# Patient Record
Sex: Male | Born: 1995 | Hispanic: No | Marital: Single | Smoking: Never smoker
Health system: Southern US, Community
[De-identification: ages and names within clinical notes are randomized; demographics above are authoritative.]

## PROBLEM LIST (undated history)

## (undated) DIAGNOSIS — F840 Autistic disorder: Secondary | ICD-10-CM

## (undated) DIAGNOSIS — G4730 Sleep apnea, unspecified: Secondary | ICD-10-CM

## (undated) DIAGNOSIS — R569 Unspecified convulsions: Secondary | ICD-10-CM

## (undated) DIAGNOSIS — I10 Essential (primary) hypertension: Secondary | ICD-10-CM

---
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
## 2014-02-01 DIAGNOSIS — Z79899 Other long term (current) drug therapy: Secondary | ICD-10-CM | POA: Diagnosis not present

## 2014-02-07 DIAGNOSIS — Z79899 Other long term (current) drug therapy: Secondary | ICD-10-CM | POA: Diagnosis not present

## 2014-02-15 DIAGNOSIS — Z79899 Other long term (current) drug therapy: Secondary | ICD-10-CM | POA: Diagnosis not present

## 2014-02-21 DIAGNOSIS — Z79899 Other long term (current) drug therapy: Secondary | ICD-10-CM | POA: Diagnosis not present

## 2014-02-28 DIAGNOSIS — Z79899 Other long term (current) drug therapy: Secondary | ICD-10-CM | POA: Diagnosis not present

## 2014-03-08 DIAGNOSIS — Z79899 Other long term (current) drug therapy: Secondary | ICD-10-CM | POA: Diagnosis not present

## 2014-03-14 DIAGNOSIS — Z79899 Other long term (current) drug therapy: Secondary | ICD-10-CM | POA: Diagnosis not present

## 2014-03-21 DIAGNOSIS — Z79899 Other long term (current) drug therapy: Secondary | ICD-10-CM | POA: Diagnosis not present

## 2014-03-28 DIAGNOSIS — Z79899 Other long term (current) drug therapy: Secondary | ICD-10-CM | POA: Diagnosis not present

## 2014-04-04 DIAGNOSIS — Z79899 Other long term (current) drug therapy: Secondary | ICD-10-CM | POA: Diagnosis not present

## 2014-04-11 DIAGNOSIS — Z79899 Other long term (current) drug therapy: Secondary | ICD-10-CM | POA: Diagnosis not present

## 2014-04-18 DIAGNOSIS — Z79899 Other long term (current) drug therapy: Secondary | ICD-10-CM | POA: Diagnosis not present

## 2014-04-25 DIAGNOSIS — Z79899 Other long term (current) drug therapy: Secondary | ICD-10-CM | POA: Diagnosis not present

## 2014-05-02 DIAGNOSIS — Z79899 Other long term (current) drug therapy: Secondary | ICD-10-CM | POA: Diagnosis not present

## 2014-05-09 DIAGNOSIS — Z79899 Other long term (current) drug therapy: Secondary | ICD-10-CM | POA: Diagnosis not present

## 2014-05-14 NOTE — Consult Note (Signed)
PATIENT NAME:  Angel Rice, Angel Rice MR#:  [id number] DATE OF BIRTH:  05/11/1985  DATE OF CONSULTATION:  08/26/2013  REFERRING PHYSICIAN:   CONSULTING PHYSICIAN:  Audery Amel, MD  IDENTIFYING INFORMATION AND REASON FOR CONSULTATION: A 19 year old male with a history of autism brought here voluntarily by his group home with reports that he has been agitated recently. The patient unable to give a chief complaint.   HISTORY OF PRESENT ILLNESS: Information obtained secondhand, primarily from the social worker at this point, and from observation of the patient. He was brought here without commitment papers. I have heard from the social worker that we have learned that in the last couple of weeks they have thought that he is showing a different mental status than usual. He has been more agitated. I have not yet heard any specific reports of him being violent or threatening. Not sleeping well, more confused. There is a report that sometimes he seems to be hallucinating. The patient is not able to give any history at all. We are told that they have tried "many medicine changes" recently, although I do not have the specifics about those and that none of them have helped.  PAST PSYCHIATRIC HISTORY: Also unclear at this point. We do not have a prior record of him. Unknown where and when he has ever been hospitalized in the past. The diagnosis we are being told is autism. He is currently being treated with antipsychotics and antidepressants as well as some general tranquilizers. Secondhand I have heard nonspecific reports of a history of aggression and possible violence, although I do not know the details.   SOCIAL HISTORY: He has a legal guardian. Not a family member from what I can tell. Currently residing in a group home. Other details lacking right now, except that the group home has said that he is welcome to come back when he is well.   PAST MEDICAL HISTORY: Just judging from the medications he is on, it  looks like nothing very significant, maybe seasonal allergies, gastric reflux symptoms and constipation.   CURRENT MEDICATIONS: Seroquel XL 200 mg in the morning and 400 mg at night, sertraline 100 mg per day, guaifenesin 1 mg 3 times a day, clonazepam 0.5 mg 3 times a day, Pepcid 20 mg twice a day, MiraLax 17 grams a day p.r.n., trazodone 100 mg at night, Seroquel 50 mg q. 4 to 6 hours p.r.n. for agitation.   ALLERGIES: BENZODIAZEPINES, ERYTHROMYCIN AND SULFA DRUGS. The benzodiazepine one obviously jumps out to me since I am getting a medicine reconciliation that says he is taking Klonopin.   MENTAL STATUS EXAMINATION: Adequately groomed man who looks his stated age or younger. He is awake and alert and makes some attempt to communicate, but he is clearly very impaired. Eye contact intermittent at best. Fidgety psychomotor activity, but not aggressive. Speech is flat in tone, at times loud, not hostile. His thoughts are marked by echolalia. He repeats almost everything I say to him and he has been doing that with other staff as well. Does not look like he is doing it in order to be oppositional, but just seems to be an automatic response. Unclear if he is responding to internal stimuli. Not behaving to hurt himself, not aggressive to anyone else. Further cognitive testing impossible.   REVIEW OF SYSTEMS: He is not looking like he has a specific physical complaint. Unable to do the full review of systems.   LABORATORY RESULTS: Drug screen positive for tricyclics and  benzodiazepines. TSH normal. Salicylates negative. CBC normal. Alcohol level negative. Nothing remarkable on the chemistry panel at all.   VITAL SIGNS: Blood pressure normal at 119/88, respirations 20, pulse 103, temperature 98.   ASSESSMENT: This is a 19 year old man with a reported history of autism. He clearly also would have chronic cognitive impairment. Not able to cooperate with any interview right now, but not aggressive or hostile.  Full present and past details are lacking. Not clear what his baseline is.   TREATMENT PLAN: His diagnosis would generally preclude admission to our facility. It is not clear what his specific needs are, but he would be very difficult to manage downstairs and not able to cooperate with any programming. For now he will be in the Emergency Room while we gather further history. I have restarted his medications. After seeing this information that I have, however, I think I am going to discontinue the clonazepam as someone has thought benzodiazepines were a problem for him in the past. He does have Seroquel as a p.r.n.   DIAGNOSIS, PRINCIPAL AND PRIMARY:  AXIS I: Autistic disorder with behavioral disturbance.   SECONDARY DIAGNOSIS: Probable chronic cognitive impairment. No other medical diagnosis.  ____________________________ Audery AmelJohn T. Clapacs, MD jtc:sb D: 08/26/2013 11:59:59 ET T: 08/26/2013 12:25:13 ET JOB#: [id number]  cc: Audery AmelJohn T. Clapacs, MD, <Dictator> Audery AmelJOHN T CLAPACS MD ELECTRONICALLY SIGNED 09/16/2013 22:43

## 2014-05-14 NOTE — Consult Note (Signed)
Psychiatry: Reevaluated. Patient with autism and MR. Brought voluntarily to the hospital due to behavior concerns at home. Since being here he has mostly been sleepy and when awake has been only mildly agitated and easily redirectable by ER staff. Has not been violent or self-injurious. Does not appear to be acutely dangerous. Patient's guardian spoke with us this morning and states his current presentation is his baseline. The goal of the patient's team is to arrange for [facility] to work with the patient.This process has been started but there is a wait until services will be availuible. Meanwhile, patient does not neeed acute hospital level care. I have held his benzos based on history that they were agitating to him. Other meds continued and nothing new started. Group home notified. Guardien notified of plan. Discussed with ER attending. Discharge pending.Autism with significant cognitive impairment and behaavioral disturbance  Electronic Signatures: Shota Kohrs, Jackquline DenmarkJohn T (MD)  (Signed on 07-Aug-15 15:45)  Authored  Last Updated: 07-Aug-15 15:45 by Audery Amellapacs, Alexus Michael T (MD)

## 2014-05-16 DIAGNOSIS — Z79899 Other long term (current) drug therapy: Secondary | ICD-10-CM | POA: Diagnosis not present

## 2014-05-20 DIAGNOSIS — Z5189 Encounter for other specified aftercare: Secondary | ICD-10-CM | POA: Diagnosis not present

## 2014-05-20 DIAGNOSIS — F809 Developmental disorder of speech and language, unspecified: Secondary | ICD-10-CM | POA: Diagnosis not present

## 2014-05-23 DIAGNOSIS — Z79899 Other long term (current) drug therapy: Secondary | ICD-10-CM | POA: Diagnosis not present

## 2014-05-24 DIAGNOSIS — J343 Hypertrophy of nasal turbinates: Secondary | ICD-10-CM | POA: Diagnosis not present

## 2014-05-24 DIAGNOSIS — J3089 Other allergic rhinitis: Secondary | ICD-10-CM | POA: Diagnosis not present

## 2014-05-24 DIAGNOSIS — J302 Other seasonal allergic rhinitis: Secondary | ICD-10-CM | POA: Diagnosis not present

## 2014-05-24 DIAGNOSIS — G4730 Sleep apnea, unspecified: Secondary | ICD-10-CM | POA: Diagnosis not present

## 2014-05-30 DIAGNOSIS — Z79899 Other long term (current) drug therapy: Secondary | ICD-10-CM | POA: Diagnosis not present

## 2014-06-02 DIAGNOSIS — F840 Autistic disorder: Secondary | ICD-10-CM | POA: Diagnosis not present

## 2014-06-02 DIAGNOSIS — F72 Severe intellectual disabilities: Secondary | ICD-10-CM | POA: Diagnosis not present

## 2014-06-06 DIAGNOSIS — Z79899 Other long term (current) drug therapy: Secondary | ICD-10-CM | POA: Diagnosis not present

## 2014-06-13 DIAGNOSIS — Z79899 Other long term (current) drug therapy: Secondary | ICD-10-CM | POA: Diagnosis not present

## 2014-06-21 DIAGNOSIS — Z79899 Other long term (current) drug therapy: Secondary | ICD-10-CM | POA: Diagnosis not present

## 2014-06-27 ENCOUNTER — Emergency Department
Admission: EM | Admit: 2014-06-27 | Discharge: 2014-06-27 | Disposition: A | Discharge status: Home / Self Care | Payer: Medicare Other | Attending: Emergency Medicine | Admitting: Emergency Medicine

## 2014-06-27 ENCOUNTER — Encounter: Payer: Self-pay | Admitting: Emergency Medicine

## 2014-06-27 DIAGNOSIS — Z043 Encounter for examination and observation following other accident: Secondary | ICD-10-CM | POA: Diagnosis not present

## 2014-06-27 DIAGNOSIS — Z79899 Other long term (current) drug therapy: Secondary | ICD-10-CM | POA: Diagnosis not present

## 2014-06-27 DIAGNOSIS — F840 Autistic disorder: Secondary | ICD-10-CM | POA: Insufficient documentation

## 2014-06-27 DIAGNOSIS — Y998 Other external cause status: Secondary | ICD-10-CM | POA: Insufficient documentation

## 2014-06-27 DIAGNOSIS — Z041 Encounter for examination and observation following transport accident: Secondary | ICD-10-CM

## 2014-06-27 DIAGNOSIS — Y92410 Unspecified street and highway as the place of occurrence of the external cause: Secondary | ICD-10-CM | POA: Diagnosis not present

## 2014-06-27 DIAGNOSIS — Y9389 Activity, other specified: Secondary | ICD-10-CM | POA: Insufficient documentation
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
## 2014-07-04 DIAGNOSIS — Z79899 Other long term (current) drug therapy: Secondary | ICD-10-CM | POA: Diagnosis not present

## 2014-07-11 DIAGNOSIS — Z79899 Other long term (current) drug therapy: Secondary | ICD-10-CM | POA: Diagnosis not present

## 2014-07-18 DIAGNOSIS — Z79899 Other long term (current) drug therapy: Secondary | ICD-10-CM | POA: Diagnosis not present

## 2014-07-26 DIAGNOSIS — Z79899 Other long term (current) drug therapy: Secondary | ICD-10-CM | POA: Diagnosis not present

## 2014-08-01 DIAGNOSIS — Z79899 Other long term (current) drug therapy: Secondary | ICD-10-CM | POA: Diagnosis not present

## 2014-08-15 DIAGNOSIS — Z79899 Other long term (current) drug therapy: Secondary | ICD-10-CM | POA: Diagnosis not present

## 2014-08-29 DIAGNOSIS — Z79899 Other long term (current) drug therapy: Secondary | ICD-10-CM | POA: Diagnosis not present

## 2014-09-02 DIAGNOSIS — F840 Autistic disorder: Secondary | ICD-10-CM | POA: Diagnosis not present

## 2014-09-12 DIAGNOSIS — Z79899 Other long term (current) drug therapy: Secondary | ICD-10-CM | POA: Diagnosis not present

## 2014-09-27 DIAGNOSIS — Z79899 Other long term (current) drug therapy: Secondary | ICD-10-CM | POA: Diagnosis not present

## 2014-09-30 DIAGNOSIS — F840 Autistic disorder: Secondary | ICD-10-CM | POA: Diagnosis not present

## 2014-10-10 DIAGNOSIS — Z79899 Other long term (current) drug therapy: Secondary | ICD-10-CM | POA: Diagnosis not present

## 2014-10-24 DIAGNOSIS — Z79899 Other long term (current) drug therapy: Secondary | ICD-10-CM | POA: Diagnosis not present

## 2014-11-07 DIAGNOSIS — Z79899 Other long term (current) drug therapy: Secondary | ICD-10-CM | POA: Diagnosis not present

## 2014-11-21 DIAGNOSIS — Z79899 Other long term (current) drug therapy: Secondary | ICD-10-CM | POA: Diagnosis not present

## 2014-12-02 DIAGNOSIS — F72 Severe intellectual disabilities: Secondary | ICD-10-CM | POA: Diagnosis not present

## 2014-12-02 DIAGNOSIS — F840 Autistic disorder: Secondary | ICD-10-CM | POA: Diagnosis not present

## 2014-12-02 DIAGNOSIS — G4700 Insomnia, unspecified: Secondary | ICD-10-CM | POA: Diagnosis not present

## 2014-12-05 DIAGNOSIS — Z79899 Other long term (current) drug therapy: Secondary | ICD-10-CM | POA: Diagnosis not present

## 2014-12-19 DIAGNOSIS — Z79899 Other long term (current) drug therapy: Secondary | ICD-10-CM | POA: Diagnosis not present

## 2015-01-02 DIAGNOSIS — Z79899 Other long term (current) drug therapy: Secondary | ICD-10-CM | POA: Diagnosis not present

## 2015-01-04 DIAGNOSIS — F840 Autistic disorder: Secondary | ICD-10-CM | POA: Diagnosis not present

## 2015-01-17 DIAGNOSIS — Z79899 Other long term (current) drug therapy: Secondary | ICD-10-CM | POA: Diagnosis not present

## 2015-01-30 DIAGNOSIS — Z79899 Other long term (current) drug therapy: Secondary | ICD-10-CM | POA: Diagnosis not present

## 2015-02-07 DIAGNOSIS — F840 Autistic disorder: Secondary | ICD-10-CM | POA: Diagnosis not present

## 2015-02-13 DIAGNOSIS — Z79899 Other long term (current) drug therapy: Secondary | ICD-10-CM | POA: Diagnosis not present

## 2015-02-27 DIAGNOSIS — Z79899 Other long term (current) drug therapy: Secondary | ICD-10-CM | POA: Diagnosis not present

## 2015-03-10 DIAGNOSIS — F840 Autistic disorder: Secondary | ICD-10-CM | POA: Diagnosis not present

## 2015-03-10 DIAGNOSIS — Z79899 Other long term (current) drug therapy: Secondary | ICD-10-CM | POA: Diagnosis not present

## 2015-03-27 DIAGNOSIS — Z79899 Other long term (current) drug therapy: Secondary | ICD-10-CM | POA: Diagnosis not present

## 2015-04-07 DIAGNOSIS — Z79899 Other long term (current) drug therapy: Secondary | ICD-10-CM | POA: Diagnosis not present

## 2015-04-07 DIAGNOSIS — F840 Autistic disorder: Secondary | ICD-10-CM | POA: Diagnosis not present

## 2015-05-01 DIAGNOSIS — G4700 Insomnia, unspecified: Secondary | ICD-10-CM | POA: Diagnosis not present

## 2015-05-01 DIAGNOSIS — F840 Autistic disorder: Secondary | ICD-10-CM | POA: Diagnosis not present

## 2015-05-01 DIAGNOSIS — F650 Fetishism: Secondary | ICD-10-CM | POA: Diagnosis not present

## 2015-05-01 DIAGNOSIS — F72 Severe intellectual disabilities: Secondary | ICD-10-CM | POA: Diagnosis not present

## 2015-05-01 DIAGNOSIS — Z79899 Other long term (current) drug therapy: Secondary | ICD-10-CM | POA: Diagnosis not present

## 2015-05-01 DIAGNOSIS — F428 Other obsessive-compulsive disorder: Secondary | ICD-10-CM | POA: Diagnosis not present

## 2015-05-01 DIAGNOSIS — F801 Expressive language disorder: Secondary | ICD-10-CM | POA: Diagnosis not present

## 2015-05-04 DIAGNOSIS — Z79899 Other long term (current) drug therapy: Secondary | ICD-10-CM | POA: Diagnosis not present

## 2015-05-15 DIAGNOSIS — Z0000 Encounter for general adult medical examination without abnormal findings: Secondary | ICD-10-CM | POA: Diagnosis not present

## 2015-05-18 DIAGNOSIS — Z79899 Other long term (current) drug therapy: Secondary | ICD-10-CM | POA: Diagnosis not present

## 2015-05-23 DIAGNOSIS — G4730 Sleep apnea, unspecified: Secondary | ICD-10-CM | POA: Diagnosis not present

## 2015-05-26 DIAGNOSIS — G4730 Sleep apnea, unspecified: Secondary | ICD-10-CM | POA: Diagnosis not present

## 2015-05-30 DIAGNOSIS — F72 Severe intellectual disabilities: Secondary | ICD-10-CM | POA: Diagnosis not present

## 2015-05-31 DIAGNOSIS — Z79899 Other long term (current) drug therapy: Secondary | ICD-10-CM | POA: Diagnosis not present

## 2015-06-03 DIAGNOSIS — F72 Severe intellectual disabilities: Secondary | ICD-10-CM | POA: Diagnosis not present

## 2015-06-05 DIAGNOSIS — Z79899 Other long term (current) drug therapy: Secondary | ICD-10-CM | POA: Diagnosis not present

## 2015-06-05 DIAGNOSIS — F72 Severe intellectual disabilities: Secondary | ICD-10-CM | POA: Diagnosis not present

## 2015-06-06 DIAGNOSIS — F840 Autistic disorder: Secondary | ICD-10-CM | POA: Diagnosis not present

## 2015-06-06 DIAGNOSIS — G4730 Sleep apnea, unspecified: Secondary | ICD-10-CM | POA: Diagnosis not present

## 2015-06-06 DIAGNOSIS — G4700 Insomnia, unspecified: Secondary | ICD-10-CM | POA: Diagnosis not present

## 2015-06-09 DIAGNOSIS — F840 Autistic disorder: Secondary | ICD-10-CM | POA: Diagnosis not present

## 2015-06-12 DIAGNOSIS — Z79899 Other long term (current) drug therapy: Secondary | ICD-10-CM | POA: Diagnosis not present

## 2015-06-20 DIAGNOSIS — Z79899 Other long term (current) drug therapy: Secondary | ICD-10-CM | POA: Diagnosis not present

## 2015-06-22 DIAGNOSIS — F840 Autistic disorder: Secondary | ICD-10-CM | POA: Diagnosis not present

## 2015-06-26 DIAGNOSIS — Z79899 Other long term (current) drug therapy: Secondary | ICD-10-CM | POA: Diagnosis not present

## 2015-07-03 DIAGNOSIS — Z79899 Other long term (current) drug therapy: Secondary | ICD-10-CM | POA: Diagnosis not present

## 2015-07-10 DIAGNOSIS — Z79899 Other long term (current) drug therapy: Secondary | ICD-10-CM | POA: Diagnosis not present

## 2015-07-17 DIAGNOSIS — Z79899 Other long term (current) drug therapy: Secondary | ICD-10-CM | POA: Diagnosis not present

## 2015-07-26 DIAGNOSIS — Z79899 Other long term (current) drug therapy: Secondary | ICD-10-CM | POA: Diagnosis not present

## 2015-08-01 DIAGNOSIS — Z79899 Other long term (current) drug therapy: Secondary | ICD-10-CM | POA: Diagnosis not present

## 2015-08-07 DIAGNOSIS — Z79899 Other long term (current) drug therapy: Secondary | ICD-10-CM | POA: Diagnosis not present

## 2015-08-14 DIAGNOSIS — Z79899 Other long term (current) drug therapy: Secondary | ICD-10-CM | POA: Diagnosis not present

## 2015-08-21 DIAGNOSIS — Z79899 Other long term (current) drug therapy: Secondary | ICD-10-CM | POA: Diagnosis not present

## 2015-08-28 DIAGNOSIS — Z79899 Other long term (current) drug therapy: Secondary | ICD-10-CM | POA: Diagnosis not present

## 2015-08-30 DIAGNOSIS — F840 Autistic disorder: Secondary | ICD-10-CM | POA: Diagnosis not present

## 2015-08-30 DIAGNOSIS — G4730 Sleep apnea, unspecified: Secondary | ICD-10-CM | POA: Diagnosis not present

## 2015-09-04 DIAGNOSIS — Z79899 Other long term (current) drug therapy: Secondary | ICD-10-CM | POA: Diagnosis not present

## 2015-09-15 DIAGNOSIS — F840 Autistic disorder: Secondary | ICD-10-CM | POA: Diagnosis not present

## 2015-09-15 DIAGNOSIS — Z79899 Other long term (current) drug therapy: Secondary | ICD-10-CM | POA: Diagnosis not present

## 2015-09-18 DIAGNOSIS — Z79899 Other long term (current) drug therapy: Secondary | ICD-10-CM | POA: Diagnosis not present

## 2015-09-26 DIAGNOSIS — Z79899 Other long term (current) drug therapy: Secondary | ICD-10-CM | POA: Diagnosis not present

## 2015-10-02 DIAGNOSIS — Z79899 Other long term (current) drug therapy: Secondary | ICD-10-CM | POA: Diagnosis not present

## 2015-10-09 DIAGNOSIS — Z79899 Other long term (current) drug therapy: Secondary | ICD-10-CM | POA: Diagnosis not present

## 2015-10-17 DIAGNOSIS — Z79899 Other long term (current) drug therapy: Secondary | ICD-10-CM | POA: Diagnosis not present

## 2015-10-23 DIAGNOSIS — Z79899 Other long term (current) drug therapy: Secondary | ICD-10-CM | POA: Diagnosis not present

## 2015-10-31 DIAGNOSIS — Z79899 Other long term (current) drug therapy: Secondary | ICD-10-CM | POA: Diagnosis not present

## 2015-11-06 DIAGNOSIS — Z79899 Other long term (current) drug therapy: Secondary | ICD-10-CM | POA: Diagnosis not present

## 2015-11-14 DIAGNOSIS — Z79899 Other long term (current) drug therapy: Secondary | ICD-10-CM | POA: Diagnosis not present

## 2015-11-20 DIAGNOSIS — Z79899 Other long term (current) drug therapy: Secondary | ICD-10-CM | POA: Diagnosis not present

## 2015-11-27 DIAGNOSIS — F840 Autistic disorder: Secondary | ICD-10-CM | POA: Diagnosis not present

## 2015-11-27 DIAGNOSIS — Z79899 Other long term (current) drug therapy: Secondary | ICD-10-CM | POA: Diagnosis not present

## 2015-12-04 DIAGNOSIS — Z79899 Other long term (current) drug therapy: Secondary | ICD-10-CM | POA: Diagnosis not present

## 2015-12-11 DIAGNOSIS — G4730 Sleep apnea, unspecified: Secondary | ICD-10-CM | POA: Diagnosis not present

## 2015-12-11 DIAGNOSIS — Z79899 Other long term (current) drug therapy: Secondary | ICD-10-CM | POA: Diagnosis not present

## 2015-12-11 DIAGNOSIS — Z23 Encounter for immunization: Secondary | ICD-10-CM | POA: Diagnosis not present

## 2015-12-11 DIAGNOSIS — F840 Autistic disorder: Secondary | ICD-10-CM | POA: Diagnosis not present

## 2015-12-11 DIAGNOSIS — F72 Severe intellectual disabilities: Secondary | ICD-10-CM | POA: Diagnosis not present

## 2015-12-12 DIAGNOSIS — F840 Autistic disorder: Secondary | ICD-10-CM | POA: Diagnosis not present

## 2015-12-19 DIAGNOSIS — Z79899 Other long term (current) drug therapy: Secondary | ICD-10-CM | POA: Diagnosis not present

## 2015-12-26 DIAGNOSIS — Z79899 Other long term (current) drug therapy: Secondary | ICD-10-CM | POA: Diagnosis not present

## 2016-01-01 DIAGNOSIS — Z79899 Other long term (current) drug therapy: Secondary | ICD-10-CM | POA: Diagnosis not present

## 2016-01-03 ENCOUNTER — Encounter: Payer: Self-pay | Admitting: Emergency Medicine

## 2016-01-03 ENCOUNTER — Emergency Department: Payer: Medicare Other

## 2016-01-03 ENCOUNTER — Emergency Department
Admission: EM | Admit: 2016-01-03 | Discharge: 2016-01-03 | Disposition: A | Discharge status: Home / Self Care | Payer: Medicare Other | Attending: Emergency Medicine | Admitting: Emergency Medicine

## 2016-01-03 DIAGNOSIS — F840 Autistic disorder: Secondary | ICD-10-CM | POA: Diagnosis not present

## 2016-01-03 DIAGNOSIS — R410 Disorientation, unspecified: Secondary | ICD-10-CM | POA: Diagnosis not present

## 2016-01-03 DIAGNOSIS — R569 Unspecified convulsions: Secondary | ICD-10-CM | POA: Insufficient documentation

## 2016-01-03 DIAGNOSIS — Z5181 Encounter for therapeutic drug level monitoring: Secondary | ICD-10-CM | POA: Insufficient documentation

## 2016-01-03 HISTORY — DX: Autistic disorder: F84.0

## 2016-01-03 LAB — BASIC METABOLIC PANEL
ANION GAP: 5 (ref 5–15)
BUN: 10 mg/dL (ref 6–20)
CO2: 25 mmol/L (ref 22–32)
Calcium: 9.6 mg/dL (ref 8.9–10.3)
Chloride: 108 mmol/L (ref 101–111)
Creatinine, Ser: 0.92 mg/dL (ref 0.61–1.24)
GLUCOSE: 110 mg/dL — AB (ref 65–99)
POTASSIUM: 4 mmol/L (ref 3.5–5.1)
Sodium: 138 mmol/L (ref 135–145)

## 2016-01-03 LAB — URINE DRUG SCREEN, QUALITATIVE (ARMC ONLY)
AMPHETAMINES, UR SCREEN: NOT DETECTED
BENZODIAZEPINE, UR SCRN: POSITIVE — AB
Barbiturates, Ur Screen: NOT DETECTED
Cannabinoid 50 Ng, Ur ~~LOC~~: NOT DETECTED
Cocaine Metabolite,Ur ~~LOC~~: NOT DETECTED
MDMA (Ecstasy)Ur Screen: NOT DETECTED
METHADONE SCREEN, URINE: NOT DETECTED
Opiate, Ur Screen: NOT DETECTED
Phencyclidine (PCP) Ur S: NOT DETECTED
TRICYCLIC, UR SCREEN: NOT DETECTED

## 2016-01-03 LAB — URINALYSIS, ROUTINE W REFLEX MICROSCOPIC
Bilirubin Urine: NEGATIVE
Glucose, UA: NEGATIVE mg/dL
Hgb urine dipstick: NEGATIVE
Ketones, ur: NEGATIVE mg/dL
LEUKOCYTES UA: NEGATIVE
NITRITE: NEGATIVE
Protein, ur: NEGATIVE mg/dL
Specific Gravity, Urine: 1.015 (ref 1.005–1.030)
pH: 7 (ref 5.0–8.0)

## 2016-01-03 LAB — CBC
HCT: 43.7 % (ref 40.0–52.0)
Hemoglobin: 14.7 g/dL (ref 13.0–18.0)
MCH: 26.9 pg (ref 26.0–34.0)
MCHC: 33.6 g/dL (ref 32.0–36.0)
MCV: 80.1 fL (ref 80.0–100.0)
PLATELETS: 317 10*3/uL (ref 150–440)
RBC: 5.46 MIL/uL (ref 4.40–5.90)
RDW: 14.6 % — ABNORMAL HIGH (ref 11.5–14.5)
WBC: 5.7 10*3/uL (ref 3.8–10.6)

## 2016-01-03 NOTE — ED Notes (Signed)
Patient returned from radiology

## 2016-01-03 NOTE — ED Notes (Signed)
Patient transported to CT 

## 2016-01-03 NOTE — ED Provider Notes (Signed)
[facility] Emergency Department Provider Note  Time seen: 6:04 PM  I have reviewed the triage vital signs and the nursing notes.   HISTORY  Chief Complaint Seizures    HPI Angel Rice is a 20 y.o. male with a past medical history of autism who presents the emergency department for a presumed seizure. According to staff who arrived with the patient patient was heard gurgling, they noted that the patient was slumped over in his chair with rapid eye movement and some jerking of his extremities. States this lasted 3 or 4 minutes. After which the patient was very confused, and less responsive. However upon the time EMS arrived staff states the patient had returned back to his baseline. Patient has a history of autism, cannot contribute to his history. He is able to talk, mostly repeats what use a, according to the staff member here with the patient this is the patient baseline.  Past Medical History:  Diagnosis Date  . Autism     There are no active problems to display for this patient.   No past surgical history on file.  Prior to Admission medications   Not on File    Allergies  Allergen Reactions  . Benzodiazepines   . Erythromycin   . Sulfa Antibiotics     No family history on file.  Social History Social History  Substance Use Topics  . Smoking status: Never Smoker  . Smokeless tobacco: Not on file  . Alcohol use No    Review of Systems Unable to complete a review of systems due to significant autism/baseline mental status.  ____________________________________________   PHYSICAL EXAM:  VITAL SIGNS: ED Triage Vitals [01/03/16 1739]  Enc Vitals Group     BP (!) 108/45     Pulse Rate (!) 119     Resp 14     Temp 98 F (36.7 C)     Temp Source Oral     SpO2 96 %     Weight 211 lb 2 oz (95.8 kg)     Height 5\' 11"  (1.803 m)     Head Circumference      Peak Flow      Pain Score      Pain Loc      Pain Edu?      Excl. in  GC?     Constitutional: Alert. No acute distress. Per staff patient acting at baseline. Eyes: Normal exam, 3 mm PERRL. ENT   Head: Normocephalic and atraumatic.   Mouth/Throat: Mucous membranes are moist. Cardiovascular: Regular rhythm, rate around 100 bpm. No murmur. Respiratory: Normal respiratory effort without tachypnea nor retractions. Breath sounds are clear  Gastrointestinal: Soft and nontender. No distention.   Musculoskeletal: Nontender with normal range of motion in all extremities.  Neurologic:  Normal speech, baseline mental status per staff. No gross focal neurologic deficit. Able to move all extremities. Skin:  Skin is warm, dry and intact.  Psychiatric: Mood and affect are normal.   ____________________________________________    EKG  EKG reviewed and interpreted by myself shows sinus tachycardia 121 bpm, narrow QRS, normal axis, normal intervals, no concerning ST changes.  ____________________________________________    RADIOLOGY  CT head negative  ____________________________________________   INITIAL IMPRESSION / ASSESSMENT AND PLAN / ED COURSE  Pertinent labs & imaging results that were available during my care of the patient were reviewed by me and considered in my medical decision making (see chart for details).  Patient presents the emergency department  but appears to be a first-time seizure. Patient has severe autism at baseline, cannot contribute to his history. Staff is here with the patient who states he is back to his baseline mental status. No focal deficits. We will check labs, urinalysis and obtain a CT scan of the head as this is the patient's first seizure.  CT head negative. Labs are largely within normal limits. Patient will be discharged home with neurology follow-up. I discussed with the staff member to return to the emergency department for any further seizure-like activity.  ____________________________________________   FINAL  CLINICAL IMPRESSION(S) / ED DIAGNOSES  Seizure    Minna AntisKevin Mumtaz Lovins, MD 01/03/16 2053

## 2016-01-03 NOTE — ED Triage Notes (Signed)
Pt arrived via EMS from group home for reports of seizure. Pt does not have known seizure disorder per EMS report. Group home staff report witnessing pt having grand mal seizure activity. Pt is autistic. Group home staff reported to EMS pt has returned to baseline behavior. EMS reports VSS, CBG 140. Pt alert on arrival.

## 2016-01-03 NOTE — Discharge Instructions (Signed)
Please call the number provided for neurology to arrange a follow-up appointment as soon as possible. Return to the emergency department for any further seizure-like activity, or any other symptom personally concerning to yourself.

## 2016-01-10 DIAGNOSIS — F840 Autistic disorder: Secondary | ICD-10-CM | POA: Diagnosis not present

## 2016-01-11 DIAGNOSIS — Z79899 Other long term (current) drug therapy: Secondary | ICD-10-CM | POA: Diagnosis not present

## 2016-01-16 DIAGNOSIS — Z79899 Other long term (current) drug therapy: Secondary | ICD-10-CM | POA: Diagnosis not present

## 2016-01-23 DIAGNOSIS — G4733 Obstructive sleep apnea (adult) (pediatric): Secondary | ICD-10-CM | POA: Diagnosis not present

## 2016-01-23 DIAGNOSIS — F840 Autistic disorder: Secondary | ICD-10-CM | POA: Diagnosis not present

## 2016-01-23 DIAGNOSIS — R569 Unspecified convulsions: Secondary | ICD-10-CM | POA: Diagnosis not present

## 2016-01-24 DIAGNOSIS — Z79899 Other long term (current) drug therapy: Secondary | ICD-10-CM | POA: Diagnosis not present

## 2016-01-29 DIAGNOSIS — Z79899 Other long term (current) drug therapy: Secondary | ICD-10-CM | POA: Diagnosis not present

## 2016-02-06 DIAGNOSIS — Z79899 Other long term (current) drug therapy: Secondary | ICD-10-CM | POA: Diagnosis not present

## 2016-02-13 DIAGNOSIS — Z79899 Other long term (current) drug therapy: Secondary | ICD-10-CM | POA: Diagnosis not present

## 2016-02-19 DIAGNOSIS — Z79899 Other long term (current) drug therapy: Secondary | ICD-10-CM | POA: Diagnosis not present

## 2016-02-26 DIAGNOSIS — Z79899 Other long term (current) drug therapy: Secondary | ICD-10-CM | POA: Diagnosis not present

## 2016-03-04 DIAGNOSIS — Z79899 Other long term (current) drug therapy: Secondary | ICD-10-CM | POA: Diagnosis not present

## 2016-03-06 DIAGNOSIS — F840 Autistic disorder: Secondary | ICD-10-CM | POA: Diagnosis not present

## 2016-03-11 DIAGNOSIS — Z79899 Other long term (current) drug therapy: Secondary | ICD-10-CM | POA: Diagnosis not present

## 2016-03-18 DIAGNOSIS — Z79899 Other long term (current) drug therapy: Secondary | ICD-10-CM | POA: Diagnosis not present

## 2016-03-25 DIAGNOSIS — R569 Unspecified convulsions: Secondary | ICD-10-CM | POA: Diagnosis not present

## 2016-03-26 DIAGNOSIS — Z79899 Other long term (current) drug therapy: Secondary | ICD-10-CM | POA: Diagnosis not present

## 2016-03-27 DIAGNOSIS — F840 Autistic disorder: Secondary | ICD-10-CM | POA: Diagnosis not present

## 2016-04-02 DIAGNOSIS — Z79899 Other long term (current) drug therapy: Secondary | ICD-10-CM | POA: Diagnosis not present

## 2016-04-09 DIAGNOSIS — Z79899 Other long term (current) drug therapy: Secondary | ICD-10-CM | POA: Diagnosis not present

## 2016-04-16 DIAGNOSIS — Z79899 Other long term (current) drug therapy: Secondary | ICD-10-CM | POA: Diagnosis not present

## 2016-04-23 DIAGNOSIS — Z79899 Other long term (current) drug therapy: Secondary | ICD-10-CM | POA: Diagnosis not present

## 2016-04-30 DIAGNOSIS — F840 Autistic disorder: Secondary | ICD-10-CM | POA: Diagnosis not present

## 2016-04-30 DIAGNOSIS — Z79899 Other long term (current) drug therapy: Secondary | ICD-10-CM | POA: Diagnosis not present

## 2016-05-06 DIAGNOSIS — Z0000 Encounter for general adult medical examination without abnormal findings: Secondary | ICD-10-CM | POA: Diagnosis not present

## 2016-05-07 DIAGNOSIS — Z79899 Other long term (current) drug therapy: Secondary | ICD-10-CM | POA: Diagnosis not present

## 2016-05-14 DIAGNOSIS — Z79899 Other long term (current) drug therapy: Secondary | ICD-10-CM | POA: Diagnosis not present

## 2016-05-20 ENCOUNTER — Encounter: Payer: Self-pay | Admitting: Emergency Medicine

## 2016-05-20 ENCOUNTER — Emergency Department
Admission: EM | Admit: 2016-05-20 | Discharge: 2016-05-20 | Disposition: A | Discharge status: Home / Self Care | Payer: Medicare Other | Attending: Student in an Organized Health Care Education/Training Program | Admitting: Student in an Organized Health Care Education/Training Program

## 2016-05-20 DIAGNOSIS — F840 Autistic disorder: Secondary | ICD-10-CM | POA: Diagnosis not present

## 2016-05-20 DIAGNOSIS — G40909 Epilepsy, unspecified, not intractable, without status epilepticus: Secondary | ICD-10-CM | POA: Diagnosis not present

## 2016-05-20 DIAGNOSIS — R569 Unspecified convulsions: Secondary | ICD-10-CM | POA: Diagnosis present

## 2016-05-20 LAB — BASIC METABOLIC PANEL
ANION GAP: 11 (ref 5–15)
BUN: 9 mg/dL (ref 6–20)
CALCIUM: 9.9 mg/dL (ref 8.9–10.3)
CO2: 22 mmol/L (ref 22–32)
CREATININE: 0.9 mg/dL (ref 0.61–1.24)
Chloride: 107 mmol/L (ref 101–111)
Glucose, Bld: 120 mg/dL — ABNORMAL HIGH (ref 65–99)
Potassium: 4.1 mmol/L (ref 3.5–5.1)
SODIUM: 140 mmol/L (ref 135–145)

## 2016-05-20 LAB — CBC
HCT: 46.3 % (ref 40.0–52.0)
HEMOGLOBIN: 14.9 g/dL (ref 13.0–18.0)
MCH: 25.5 pg — ABNORMAL LOW (ref 26.0–34.0)
MCHC: 32.2 g/dL (ref 32.0–36.0)
MCV: 79.2 fL — ABNORMAL LOW (ref 80.0–100.0)
Platelets: 399 10*3/uL (ref 150–440)
RBC: 5.84 MIL/uL (ref 4.40–5.90)
RDW: 14.9 % — ABNORMAL HIGH (ref 11.5–14.5)
WBC: 9.1 10*3/uL (ref 3.8–10.6)

## 2016-05-20 NOTE — ED Provider Notes (Signed)
[facility] Emergency Department Provider Note    First MD Initiated Contact with Patient 05/20/16 1930     (approximate)  I have reviewed the triage vital signs and the nursing notes.   HISTORY  Chief Complaint Seizures    HPI Angel Rice is a 21 y.o. male history of autism as well as recent diagnosis of seizure disorder on lamotrigine presents with brief seizure-like episode that occurred earlier this afternoon. He was otherwise having a normal day. While playing with friends started having episode that was witnessed by the caregiver where he appeared to be shaking and making a grunting sound for less than a minute. This is followed by a 20-30 minute postictal period. There is no lateralizing features. He is currently back to baseline. Has not had a seizure since December. Is scheduled to have follow-up with neurology as an outpatient to review medications and schedule EEG.  Caregiver states the patient is otherwise acting at baseline. No recent fevers. No other symptoms.   Past Medical History:  Diagnosis Date  . Autism    FMH: no bleeding disorders History reviewed. No pertinent surgical history. There are no active problems to display for this patient.     Prior to Admission medications   Not on File    Allergies Benzodiazepines; Erythromycin; and Sulfa antibiotics    Social History Social History  Substance Use Topics  . Smoking status: Never Smoker  . Smokeless tobacco: Never Used  . Alcohol use No    Review of Systems Patient denies headaches, rhinorrhea, blurry vision, numbness, shortness of breath, chest pain, edema, cough, abdominal pain, nausea, vomiting, diarrhea, dysuria, fevers, rashes or hallucinations unless otherwise stated above in HPI. ____________________________________________   PHYSICAL EXAM:  VITAL SIGNS: Vitals:   05/20/16 1757 05/20/16 1948  BP: 113/60 112/80  Pulse: (!) 124 98  Resp: 20 16  Temp:  98.6 F (37 C) 98.5 F (36.9 C)    Constitutional: Alert and oriented. Well appearing and in no acute distress. Eyes: Conjunctivae are normal. PERRL. EOMI. Head: Atraumatic. Nose: No congestion/rhinnorhea. Mouth/Throat: Mucous membranes are moist.  Oropharynx non-erythematous. Neck: No stridor. Painless ROM. No cervical spine tenderness to palpation Hematological/Lymphatic/Immunilogical: No cervical lymphadenopathy. Cardiovascular: Normal rate, regular rhythm. Grossly normal heart sounds.  Good peripheral circulation. Respiratory: Normal respiratory effort.  No retractions. Lungs CTAB. Gastrointestinal: Soft and nontender. No distention. No abdominal bruits. No CVA tenderness. Genitourinary:  Musculoskeletal: No lower extremity tenderness nor edema.  No joint effusions. Neurologic:  Normal speech and language. No gross focal neurologic deficits are appreciated. No gait instability. Skin:  Skin is warm, dry and intact. No rash noted. Psychiatric: Mood and affect are normal. Speech and behavior are normal.  ____________________________________________   LABS (all labs ordered are listed, but only abnormal results are displayed)  Results for orders placed or performed during the hospital encounter of 05/20/16 (from the past 24 hour(s))  Basic metabolic panel - if new onset seizures     Status: Abnormal   Collection Time: 05/20/16  6:06 PM  Result Value Ref Range   Sodium 140 135 - 145 mmol/L   Potassium 4.1 3.5 - 5.1 mmol/L   Chloride 107 101 - 111 mmol/L   CO2 22 22 - 32 mmol/L   Glucose, Bld 120 (H) 65 - 99 mg/dL   BUN 9 6 - 20 mg/dL   Creatinine, Ser 1.61 0.61 - 1.24 mg/dL   Calcium 9.9 8.9 - 09.6 mg/dL   GFR calc non  Af Amer >60 >60 mL/min   GFR calc Af Amer >60 >60 mL/min   Anion gap 11 5 - 15  CBC - if new onset seizures     Status: Abnormal   Collection Time: 05/20/16  6:06 PM  Result Value Ref Range   WBC 9.1 3.8 - 10.6 K/uL   RBC 5.84 4.40 - 5.90 MIL/uL    Hemoglobin 14.9 13.0 - 18.0 g/dL   HCT 04.5 40.9 - 81.1 %   MCV 79.2 (L) 80.0 - 100.0 fL   MCH 25.5 (L) 26.0 - 34.0 pg   MCHC 32.2 32.0 - 36.0 g/dL   RDW 91.4 (H) 78.2 - 95.6 %   Platelets 399 150 - 440 K/uL   ____________________________________________  EKG My review and personal interpretation at Time: 18:05   Indication: seizure  Rate: 125  Rhythm: sinus tach Axis: normal Other: normal intervals, no st elevations ____________________________________________  RADIOLOGY  none ____________________________________________   PROCEDURES  Procedure(s) performed:  Procedures    Critical Care performed: no ____________________________________________   INITIAL IMPRESSION / ASSESSMENT AND PLAN / ED COURSE  Pertinent labs & imaging results that were available during my care of the patient were reviewed by me and considered in my medical decision making (see chart for details).  DDX: electrolye ab, seizure disorder, dysrhythmia  [name] is a 21 y.o. who presents to the ED with shaking spells as described above. Patient with no focal neurologic deficits at this time. Currently back to baseline per caregiver. Blood work obtained and triage shows no acute electrolyte abnormalities. EKG done for his tachycardia shows no evidence of dysrhythmia. Patient's well-perfused and after settling down in his ER bed his tachycardia has resolved. As he hasn't an seizure disorder and is now back to baseline and do not feel that CT imaging is clinically indicated. No evidence of traumatic injury. This point I do feel patient is stable for close follow-up with neurology.      ____________________________________________   FINAL CLINICAL IMPRESSION(S) / ED DIAGNOSES  Final diagnoses:  Seizure-like activity (HCC)      NEW MEDICATIONS STARTED DURING THIS VISIT:  New Prescriptions   No medications on file     Note:  This document was prepared using Dragon voice recognition  software and may include unintentional dictation errors.    Willy Eddy, MD 05/20/16 2005

## 2016-05-20 NOTE — ED Triage Notes (Signed)
Caregiver reports possible seizure earlier today. Pt is autisitic and does not communicate. Pt last seizure was back in December. Caregiver reports no loss of bowel or bladder during seizure.

## 2016-05-21 DIAGNOSIS — Z79899 Other long term (current) drug therapy: Secondary | ICD-10-CM | POA: Diagnosis not present

## 2016-05-28 DIAGNOSIS — Z79899 Other long term (current) drug therapy: Secondary | ICD-10-CM | POA: Diagnosis not present

## 2016-05-31 DIAGNOSIS — R569 Unspecified convulsions: Secondary | ICD-10-CM | POA: Diagnosis not present

## 2016-06-04 DIAGNOSIS — Z79899 Other long term (current) drug therapy: Secondary | ICD-10-CM | POA: Diagnosis not present

## 2016-06-06 DIAGNOSIS — G4733 Obstructive sleep apnea (adult) (pediatric): Secondary | ICD-10-CM | POA: Diagnosis not present

## 2016-06-06 DIAGNOSIS — R569 Unspecified convulsions: Secondary | ICD-10-CM | POA: Diagnosis not present

## 2016-06-11 DIAGNOSIS — Z79899 Other long term (current) drug therapy: Secondary | ICD-10-CM | POA: Diagnosis not present

## 2016-06-18 DIAGNOSIS — G40909 Epilepsy, unspecified, not intractable, without status epilepticus: Secondary | ICD-10-CM | POA: Diagnosis not present

## 2016-06-18 DIAGNOSIS — Z79899 Other long term (current) drug therapy: Secondary | ICD-10-CM | POA: Diagnosis not present

## 2016-06-20 DIAGNOSIS — H539 Unspecified visual disturbance: Secondary | ICD-10-CM | POA: Diagnosis not present

## 2016-06-25 DIAGNOSIS — Z79899 Other long term (current) drug therapy: Secondary | ICD-10-CM | POA: Diagnosis not present

## 2016-06-26 DIAGNOSIS — F840 Autistic disorder: Secondary | ICD-10-CM | POA: Diagnosis not present

## 2016-07-02 DIAGNOSIS — Z79899 Other long term (current) drug therapy: Secondary | ICD-10-CM | POA: Diagnosis not present

## 2016-07-09 DIAGNOSIS — Z79899 Other long term (current) drug therapy: Secondary | ICD-10-CM | POA: Diagnosis not present

## 2016-07-12 DIAGNOSIS — G4733 Obstructive sleep apnea (adult) (pediatric): Secondary | ICD-10-CM | POA: Diagnosis not present

## 2016-07-12 DIAGNOSIS — F840 Autistic disorder: Secondary | ICD-10-CM | POA: Diagnosis not present

## 2016-07-12 DIAGNOSIS — R569 Unspecified convulsions: Secondary | ICD-10-CM | POA: Diagnosis not present

## 2016-07-12 DIAGNOSIS — Z79899 Other long term (current) drug therapy: Secondary | ICD-10-CM | POA: Diagnosis not present

## 2016-07-16 DIAGNOSIS — Z79899 Other long term (current) drug therapy: Secondary | ICD-10-CM | POA: Diagnosis not present

## 2016-07-23 DIAGNOSIS — Z79899 Other long term (current) drug therapy: Secondary | ICD-10-CM | POA: Diagnosis not present

## 2016-07-30 DIAGNOSIS — Z79899 Other long term (current) drug therapy: Secondary | ICD-10-CM | POA: Diagnosis not present

## 2016-07-31 DIAGNOSIS — F840 Autistic disorder: Secondary | ICD-10-CM | POA: Diagnosis not present

## 2016-08-06 DIAGNOSIS — Z79899 Other long term (current) drug therapy: Secondary | ICD-10-CM | POA: Diagnosis not present

## 2016-08-13 DIAGNOSIS — Z79899 Other long term (current) drug therapy: Secondary | ICD-10-CM | POA: Diagnosis not present

## 2016-08-20 DIAGNOSIS — Z79899 Other long term (current) drug therapy: Secondary | ICD-10-CM | POA: Diagnosis not present

## 2016-08-27 DIAGNOSIS — Z79899 Other long term (current) drug therapy: Secondary | ICD-10-CM | POA: Diagnosis not present

## 2016-08-29 DIAGNOSIS — F840 Autistic disorder: Secondary | ICD-10-CM | POA: Diagnosis not present

## 2016-09-03 DIAGNOSIS — Z79899 Other long term (current) drug therapy: Secondary | ICD-10-CM | POA: Diagnosis not present

## 2016-09-10 DIAGNOSIS — Z79899 Other long term (current) drug therapy: Secondary | ICD-10-CM | POA: Diagnosis not present

## 2016-09-17 DIAGNOSIS — Z79899 Other long term (current) drug therapy: Secondary | ICD-10-CM | POA: Diagnosis not present

## 2016-09-24 DIAGNOSIS — Z79899 Other long term (current) drug therapy: Secondary | ICD-10-CM | POA: Diagnosis not present

## 2016-10-01 DIAGNOSIS — Z79899 Other long term (current) drug therapy: Secondary | ICD-10-CM | POA: Diagnosis not present

## 2016-10-08 DIAGNOSIS — Z79899 Other long term (current) drug therapy: Secondary | ICD-10-CM | POA: Diagnosis not present

## 2016-10-16 DIAGNOSIS — Z79899 Other long term (current) drug therapy: Secondary | ICD-10-CM | POA: Diagnosis not present

## 2016-10-22 DIAGNOSIS — Z79899 Other long term (current) drug therapy: Secondary | ICD-10-CM | POA: Diagnosis not present

## 2016-10-29 DIAGNOSIS — Z79899 Other long term (current) drug therapy: Secondary | ICD-10-CM | POA: Diagnosis not present

## 2016-11-05 DIAGNOSIS — Z79899 Other long term (current) drug therapy: Secondary | ICD-10-CM | POA: Diagnosis not present

## 2016-11-12 DIAGNOSIS — Z79899 Other long term (current) drug therapy: Secondary | ICD-10-CM | POA: Diagnosis not present

## 2016-11-19 DIAGNOSIS — Z79899 Other long term (current) drug therapy: Secondary | ICD-10-CM | POA: Diagnosis not present

## 2016-11-26 DIAGNOSIS — Z79899 Other long term (current) drug therapy: Secondary | ICD-10-CM | POA: Diagnosis not present

## 2016-11-28 DIAGNOSIS — F840 Autistic disorder: Secondary | ICD-10-CM | POA: Diagnosis not present

## 2016-12-03 DIAGNOSIS — Z79899 Other long term (current) drug therapy: Secondary | ICD-10-CM | POA: Diagnosis not present

## 2016-12-03 DIAGNOSIS — G40909 Epilepsy, unspecified, not intractable, without status epilepticus: Secondary | ICD-10-CM | POA: Diagnosis not present

## 2016-12-03 DIAGNOSIS — F840 Autistic disorder: Secondary | ICD-10-CM | POA: Diagnosis not present

## 2016-12-08 ENCOUNTER — Emergency Department: Payer: Medicare Other

## 2016-12-08 ENCOUNTER — Emergency Department
Admission: EM | Admit: 2016-12-08 | Discharge: 2016-12-08 | Disposition: A | Discharge status: Home / Self Care | Payer: Medicare Other | Attending: Emergency Medicine | Admitting: Emergency Medicine

## 2016-12-08 ENCOUNTER — Other Ambulatory Visit: Payer: Self-pay

## 2016-12-08 ENCOUNTER — Encounter: Payer: Self-pay | Admitting: Emergency Medicine

## 2016-12-08 DIAGNOSIS — S6992XA Unspecified injury of left wrist, hand and finger(s), initial encounter: Secondary | ICD-10-CM | POA: Diagnosis present

## 2016-12-08 DIAGNOSIS — Y939 Activity, unspecified: Secondary | ICD-10-CM | POA: Insufficient documentation

## 2016-12-08 DIAGNOSIS — I10 Essential (primary) hypertension: Secondary | ICD-10-CM | POA: Insufficient documentation

## 2016-12-08 DIAGNOSIS — F840 Autistic disorder: Secondary | ICD-10-CM | POA: Diagnosis not present

## 2016-12-08 DIAGNOSIS — W230XXA Caught, crushed, jammed, or pinched between moving objects, initial encounter: Secondary | ICD-10-CM | POA: Diagnosis not present

## 2016-12-08 DIAGNOSIS — Y929 Unspecified place or not applicable: Secondary | ICD-10-CM | POA: Insufficient documentation

## 2016-12-08 DIAGNOSIS — S62617A Displaced fracture of proximal phalanx of left little finger, initial encounter for closed fracture: Secondary | ICD-10-CM | POA: Diagnosis not present

## 2016-12-08 DIAGNOSIS — S62615A Displaced fracture of proximal phalanx of left ring finger, initial encounter for closed fracture: Secondary | ICD-10-CM | POA: Diagnosis not present

## 2016-12-08 DIAGNOSIS — Y999 Unspecified external cause status: Secondary | ICD-10-CM | POA: Insufficient documentation

## 2016-12-08 DIAGNOSIS — S63275A Dislocation of unspecified interphalangeal joint of left ring finger, initial encounter: Secondary | ICD-10-CM | POA: Diagnosis not present

## 2016-12-08 HISTORY — DX: Essential (primary) hypertension: I10

## 2016-12-08 MED ORDER — IBUPROFEN 800 MG PO TABS: 800.0000 mg | ORAL_TABLET | Freq: Once | ORAL | Status: DC

## 2016-12-08 MED ORDER — HYDROCODONE-ACETAMINOPHEN 5-325 MG PO TABS: 1.0000 | ORAL_TABLET | Freq: Four times a day (QID) | ORAL | 0 refills | Status: DC | PRN

## 2016-12-08 MED ORDER — HYDROCODONE-ACETAMINOPHEN 5-325 MG PO TABS
1.0000 | ORAL_TABLET | Freq: Once | ORAL | Status: AC
  Administered 2016-12-08: 1 via ORAL
  Filled 2016-12-08: qty 1

## 2016-12-08 NOTE — ED Notes (Signed)
Patient injured third digit on left hand. Finger crooked and out of shape.

## 2016-12-08 NOTE — Discharge Instructions (Addendum)
Take medication as prescribed or you may take tylenol or ibuprofen as needed for pain.   Tylenol [phone] mg every 6 hours as needed Ibuprofen 600-800 mg every 6-8 hours as needed  You may also apply cold pack as needed for pain and inflammation.  Call tomorrow to schedule a follow up appointment with Orthopedics. Contact information included in discharge instructions.

## 2016-12-08 NOTE — ED Triage Notes (Signed)
First Nurse Note:  Arrives from Group Home with left hand injury.  Per Group Home Worker, patient may have been swinging hand back and forth and then jammed left hand into door, injuring left ring finger.

## 2016-12-08 NOTE — ED Triage Notes (Signed)
Pt in via POV from [facility] w/ representative from home, Arnell SievingKeith [place].  Pt with complaints of injury to left ring finger, pt unable to tell me what happened.  Swelling, bruising noted.  NAD noted at this time.

## 2016-12-08 NOTE — ED Notes (Signed)
Reviewed discharge instructions with patient and patient's care giver from group home. Patient/caregiver verbalized understanding.

## 2016-12-08 NOTE — ED Provider Notes (Signed)
[facility] Emergency Department Provider Note   ____________________________________________   I have reviewed the triage vital signs and the nursing notes.   HISTORY  Chief Complaint Finger Injury    HPI Angel Rice is a 21 y.o. male presents emergency department with left ring finger pain, swelling, ecchymosis deformity after slamming his hand in a closing door earlier this afternoon. The patient lives in a group home and Angel Rice is here with him.  [name] is a representative from the [facility] group home.  Patient denies loss of sensation and is able to actively move the digit although it appears deformed and deviated at the MCP joint.  Patient required input from [name] and providing his history due to medical history and developmental delays.  Patient denies fever, chills, headache, vision changes, chest pain, chest tightness, shortness of breath, abdominal pain, nausea and vomiting.  Past Medical History:  Diagnosis Date  . Autism   . Hypertension     There are no active problems to display for this patient.   History reviewed. No pertinent surgical history.  Prior to Admission medications   Not on File    Allergies Benzodiazepines; Erythromycin; and Sulfa antibiotics  No family history on file.  Social History Social History   Tobacco Use  . Smoking status: Never Smoker  . Smokeless tobacco: Never Used  Substance Use Topics  . Alcohol use: No  . Drug use: No    Review of Systems Constitutional: Negative for fever/chills Eyes: No visual changes. Cardiovascular: Denies chest pain. Respiratory:  Denies shortness of breath. Musculoskeletal: Positive for left ring finger pain, swelling. Skin: Negative for rash. Neurological: Negative for headaches.  ____________________________________________   PHYSICAL EXAM:  VITAL SIGNS: ED Triage Vitals  Enc Vitals Group     BP 12/08/16 1714 128/85     Pulse  Rate 12/08/16 1714 99     Resp 12/08/16 1714 18     Temp 12/08/16 1714 97.8 F (36.6 C)     Temp Source 12/08/16 1714 Axillary     SpO2 12/08/16 1714 98 %     Weight 12/08/16 1715 210 lb (95.3 kg)     Height 12/08/16 1715 6' (1.829 m)     Head Circumference --      Peak Flow --      Pain Score --      Pain Loc --      Pain Edu? --      Excl. in GC? --     Constitutional: Alert and oriented. Well appearing and in no acute distress.  Eyes: Conjunctivae are normal. PERRL. Neck:Supple. Cardiovascular: Normal rate, regular rhythm. Normal S1 and S2.  Good peripheral circulation. Respiratory: Normal respiratory effort without tachypnea or retractions.  Musculoskeletal: Left ring finger ROM limited 2/2 pain, swelling and deformity. Intact sensation noted. Other fingers ROM, sensation intact. Ecchymosis noted along proximal finger extending into the hand.  Neurologic: Normal speech and language. No gross focal neurologic deficits are appreciated.  Skin:  Skin is warm, dry and intact. No rash noted. Psychiatric: Mood and affect are normal. Speech and behavior are normal. Patient exhibits appropriate insight and judgement.  ____________________________________________   LABS (all labs ordered are listed, but only abnormal results are displayed)  Labs Reviewed - No data to display ____________________________________________  EKG none ____________________________________________  RADIOLOGY DG left hand complete FINDINGS: There is an oblique fracture through the fourth proximal phalanx without involvement of the articular surface. Mild soft tissue  swelling is noted.  IMPRESSION: Fourth proximal phalangeal fracture ____________________________________________   PROCEDURES  Procedure(s) performed: SPLINT APPLICATION Date/Time: 6:48 PM Authorized by: Clois Comberraci M Sadrac Zeoli Consent: Verbal consent obtained. Risks and benefits: risks, benefits and alternatives were discussed Consent  given by: patient Splint applied by: Jessika Rothery, PA-C Location details: left ring finger Splint type: Volar finger/hand splint  Supplies used: Ortho-glass and ACE wrap Post-procedure: The splinted body part was neurovascularly unchanged following the procedure. Patient tolerance: Patient tolerated the procedure well with no immediate complications.  Initial fracture care was provided. Follow up will be greater than 24 hours.    Critical Care performed: no ____________________________________________   INITIAL IMPRESSION / ASSESSMENT AND PLAN / ED COURSE  Pertinent labs & imaging results that were available during my care of the patient were reviewed by me and considered in my medical decision making (see chart for details).   Patient presented with presents emergency department with left ring finger pain, swelling, ecchymosis deformity after slamming his hand in a closing door earlier this afternoon. Patient physical exam and imaging findings are consistent with left ring finger proximal phalanx fracture. Volar finger/hand splint applied. Left hand neurovasculature intact following splint application. Patient was advised to follow up with Orthopedics for continued care and was also advised to return to the emergency department for symptoms that change or worsen. Patient and [name] from [facility] informed of clinical course, understand medical decision-making process, and agree with plan. ____________________________________________   FINAL CLINICAL IMPRESSION(S) / ED DIAGNOSES  Final diagnoses:  Injury of finger of left hand, initial encounter  Dislocation of finger, initial encounter       NEW MEDICATIONS STARTED DURING THIS VISIT:  This SmartLink is deprecated. Use AVSMEDLIST instead to display the medication list for a patient.   Note:  This document was prepared using Dragon voice recognition software and may include unintentional dictation errors.      Percell BostonLittle, Shadasia Oldfield M, PA-C 12/08/16 1851    Pershing ProudSchaevitz, Myra Rudeavid Matthew, MD 12/09/16 1929

## 2016-12-09 DIAGNOSIS — S62615A Displaced fracture of proximal phalanx of left ring finger, initial encounter for closed fracture: Secondary | ICD-10-CM | POA: Diagnosis not present

## 2016-12-10 ENCOUNTER — Ambulatory Visit: Payer: Medicare Other | Admitting: Anesthesiology

## 2016-12-10 ENCOUNTER — Other Ambulatory Visit: Payer: Self-pay

## 2016-12-10 ENCOUNTER — Encounter
Admission: RE | Disposition: A | Discharge status: Nursing Home (Includes ICF) | Payer: Self-pay | Source: Ambulatory Visit | Attending: Surgery

## 2016-12-10 ENCOUNTER — Ambulatory Visit
Admission: RE | Admit: 2016-12-10 | Discharge: 2016-12-10 | Disposition: A | Discharge status: Other Acute Inpatient Hospital | Payer: Medicare Other | Source: Ambulatory Visit | Attending: Surgery | Admitting: Surgery

## 2016-12-10 DIAGNOSIS — Y9289 Other specified places as the place of occurrence of the external cause: Secondary | ICD-10-CM | POA: Insufficient documentation

## 2016-12-10 DIAGNOSIS — G4700 Insomnia, unspecified: Secondary | ICD-10-CM | POA: Diagnosis not present

## 2016-12-10 DIAGNOSIS — F840 Autistic disorder: Secondary | ICD-10-CM | POA: Insufficient documentation

## 2016-12-10 DIAGNOSIS — F429 Obsessive-compulsive disorder, unspecified: Secondary | ICD-10-CM | POA: Insufficient documentation

## 2016-12-10 DIAGNOSIS — S62615A Displaced fracture of proximal phalanx of left ring finger, initial encounter for closed fracture: Secondary | ICD-10-CM | POA: Insufficient documentation

## 2016-12-10 DIAGNOSIS — W228XXA Striking against or struck by other objects, initial encounter: Secondary | ICD-10-CM | POA: Insufficient documentation

## 2016-12-10 DIAGNOSIS — I10 Essential (primary) hypertension: Secondary | ICD-10-CM | POA: Diagnosis not present

## 2016-12-10 DIAGNOSIS — G4730 Sleep apnea, unspecified: Secondary | ICD-10-CM | POA: Diagnosis not present

## 2016-12-10 HISTORY — PX: CLOSED REDUCTION METACARPAL WITH PERCUTANEOUS PINNING: SHX5613

## 2016-12-10 HISTORY — DX: Unspecified convulsions: R56.9

## 2016-12-10 SURGERY — CLOSED REDUCTION, FRACTURE, METACARPAL BONE, WITH PERCUTANEOUS PINNING
Anesthesia: General | Site: Finger | Laterality: Left | Wound class: Clean

## 2016-12-10 MED ORDER — PROPOFOL 10 MG/ML IV BOLUS
INTRAVENOUS | Status: AC
  Filled 2016-12-10: qty 40

## 2016-12-10 MED ORDER — DEXAMETHASONE SODIUM PHOSPHATE 10 MG/ML IJ SOLN
INTRAMUSCULAR | Status: DC | PRN
  Administered 2016-12-10: 10 mg via INTRAVENOUS

## 2016-12-10 MED ORDER — PROPOFOL 10 MG/ML IV BOLUS
INTRAVENOUS | Status: DC | PRN
  Administered 2016-12-10: 200 mg via INTRAVENOUS

## 2016-12-10 MED ORDER — LACTATED RINGERS IV SOLN
INTRAVENOUS | Status: DC
  Administered 2016-12-10 (×2): via INTRAVENOUS

## 2016-12-10 MED ORDER — BUPIVACAINE HCL (PF) 0.5 % IJ SOLN
INTRAMUSCULAR | Status: DC | PRN
  Administered 2016-12-10: 10 mL

## 2016-12-10 MED ORDER — FENTANYL CITRATE (PF) 100 MCG/2ML IJ SOLN
25.0000 ug | INTRAMUSCULAR | Status: DC | PRN
  Administered 2016-12-10: 25 ug via INTRAVENOUS

## 2016-12-10 MED ORDER — LIDOCAINE HCL (PF) 2 % IJ SOLN
INTRAMUSCULAR | Status: AC
  Filled 2016-12-10: qty 10

## 2016-12-10 MED ORDER — FENTANYL CITRATE (PF) 100 MCG/2ML IJ SOLN
INTRAMUSCULAR | Status: AC
  Filled 2016-12-10: qty 2

## 2016-12-10 MED ORDER — NEOMYCIN-POLYMYXIN B GU 40-200000 IR SOLN
Status: AC
  Filled 2016-12-10: qty 2

## 2016-12-10 MED ORDER — FAMOTIDINE 20 MG PO TABS: 20.0000 mg | ORAL_TABLET | Freq: Once | ORAL | Status: DC

## 2016-12-10 MED ORDER — FENTANYL CITRATE (PF) 100 MCG/2ML IJ SOLN
INTRAMUSCULAR | Status: AC
  Administered 2016-12-10: 25 ug via INTRAVENOUS
  Filled 2016-12-10: qty 2

## 2016-12-10 MED ORDER — LIDOCAINE HCL (CARDIAC) 20 MG/ML IV SOLN
INTRAVENOUS | Status: DC | PRN
  Administered 2016-12-10: 100 mg via INTRAVENOUS

## 2016-12-10 MED ORDER — MIDAZOLAM HCL 2 MG/2ML IJ SOLN
INTRAMUSCULAR | Status: AC
  Filled 2016-12-10: qty 2

## 2016-12-10 MED ORDER — CEFAZOLIN SODIUM-DEXTROSE 2-4 GM/100ML-% IV SOLN
INTRAVENOUS | Status: AC
  Filled 2016-12-10: qty 100

## 2016-12-10 MED ORDER — DEXAMETHASONE SODIUM PHOSPHATE 10 MG/ML IJ SOLN
INTRAMUSCULAR | Status: AC
  Filled 2016-12-10: qty 1

## 2016-12-10 MED ORDER — BUPIVACAINE HCL (PF) 0.5 % IJ SOLN
INTRAMUSCULAR | Status: AC
Start: 2016-12-10 — End: 2016-12-10
  Filled 2016-12-10: qty 30

## 2016-12-10 MED ORDER — ONDANSETRON HCL 4 MG/2ML IJ SOLN
INTRAMUSCULAR | Status: DC | PRN
  Administered 2016-12-10: 4 mg via INTRAVENOUS

## 2016-12-10 MED ORDER — CEFAZOLIN SODIUM-DEXTROSE 2-4 GM/100ML-% IV SOLN
2.0000 g | Freq: Once | INTRAVENOUS | Status: AC
  Administered 2016-12-10: 2 g via INTRAVENOUS

## 2016-12-10 MED ORDER — ONDANSETRON HCL 4 MG/2ML IJ SOLN: 4.0000 mg | Freq: Once | INTRAMUSCULAR | Status: DC | PRN

## 2016-12-10 MED ORDER — ONDANSETRON HCL 4 MG/2ML IJ SOLN
INTRAMUSCULAR | Status: AC
  Filled 2016-12-10: qty 2

## 2016-12-10 MED ORDER — FENTANYL CITRATE (PF) 100 MCG/2ML IJ SOLN
INTRAMUSCULAR | Status: DC | PRN
  Administered 2016-12-10: 100 ug via INTRAVENOUS
  Administered 2016-12-10 (×2): 50 ug via INTRAVENOUS

## 2016-12-10 MED ORDER — PROPOFOL 10 MG/ML IV BOLUS
INTRAVENOUS | Status: AC
  Filled 2016-12-10: qty 20

## 2016-12-10 SURGICAL SUPPLY — 42 items
BANDAGE ACE 3X5.8 VEL STRL LF (GAUZE/BANDAGES/DRESSINGS) IMPLANT
BANDAGE ACE 4X5 VEL STRL LF (GAUZE/BANDAGES/DRESSINGS) ×4 IMPLANT
BANDAGE STRETCH 3X4.1 STRL (GAUZE/BANDAGES/DRESSINGS) IMPLANT
BNDG ESMARK 4X12 TAN STRL LF (GAUZE/BANDAGES/DRESSINGS) ×4 IMPLANT
CANISTER SUCT 1200ML W/VALVE (MISCELLANEOUS) IMPLANT
CAST PADDING 3X4FT ST 30246 (SOFTGOODS)
CHLORAPREP W/TINT 26ML (MISCELLANEOUS) ×4 IMPLANT
CLOSURE WOUND 1/4X4 (GAUZE/BANDAGES/DRESSINGS)
CORD BIP STRL DISP 12FT (MISCELLANEOUS) IMPLANT
CUFF TOURN 18 STER (MISCELLANEOUS) ×4 IMPLANT
DECANTER SPIKE VIAL GLASS SM (MISCELLANEOUS) ×4 IMPLANT
DRAPE FLUOR MINI C-ARM 54X84 (DRAPES) ×4 IMPLANT
FORCEPS JEWEL BIP 4-3/4 STR (INSTRUMENTS) IMPLANT
GAUZE PETRO XEROFOAM 1X8 (MISCELLANEOUS) IMPLANT
GAUZE SPONGE 4X4 12PLY STRL (GAUZE/BANDAGES/DRESSINGS) ×4 IMPLANT
GLOVE BIO SURGEON STRL SZ8 (GLOVE) ×20 IMPLANT
GLOVE BIOGEL PI IND STRL 7.0 (GLOVE) ×2 IMPLANT
GLOVE BIOGEL PI INDICATOR 7.0 (GLOVE) ×2
GLOVE INDICATOR 8.0 STRL GRN (GLOVE) ×12 IMPLANT
GOWN STRL REUS W/ TWL LRG LVL3 (GOWN DISPOSABLE) ×2 IMPLANT
GOWN STRL REUS W/ TWL XL LVL3 (GOWN DISPOSABLE) ×4 IMPLANT
GOWN STRL REUS W/TWL LRG LVL3 (GOWN DISPOSABLE) ×2
GOWN STRL REUS W/TWL XL LVL3 (GOWN DISPOSABLE) ×4
KIT RM TURNOVER STRD PROC AR (KITS) ×4 IMPLANT
NEEDLE FILTER BLUNT 18X 1/2SAF (NEEDLE) ×2
NEEDLE FILTER BLUNT 18X1 1/2 (NEEDLE) ×2 IMPLANT
NEEDLE HYPO 25X1 1.5 SAFETY (NEEDLE) ×4 IMPLANT
NS IRRIG 500ML POUR BTL (IV SOLUTION) ×4 IMPLANT
PACK EXTREMITY ARMC (MISCELLANEOUS) ×4 IMPLANT
PAD CAST CTTN 3X4 STRL (SOFTGOODS) IMPLANT
PASSER SUT SWANSON 36MM LOOP (INSTRUMENTS) IMPLANT
SPLINT CAST 1 STEP 3X12 (MISCELLANEOUS) ×4 IMPLANT
STOCKINETTE STRL 4IN 9604848 (GAUZE/BANDAGES/DRESSINGS) ×4 IMPLANT
STRIP CLOSURE SKIN 1/4X4 (GAUZE/BANDAGES/DRESSINGS) IMPLANT
SUT PROLENE 4 0 PS 2 18 (SUTURE) IMPLANT
SUT VIC AB 4-0 SH 27 (SUTURE)
SUT VIC AB 4-0 SH 27XANBCTRL (SUTURE) IMPLANT
SWABSTK COMLB BENZOIN TINCTURE (MISCELLANEOUS) IMPLANT
SYR 3ML LL SCALE MARK (SYRINGE) ×4 IMPLANT
SYRINGE 10CC LL (SYRINGE) ×4 IMPLANT
WIRE Z .045 C-WIRE SPADE TIP (WIRE) ×12 IMPLANT
c-wire ×12 IMPLANT

## 2016-12-10 NOTE — Anesthesia Postprocedure Evaluation (Signed)
Anesthesia Post Note  Patient: Angel Rice  Procedure(s) Performed: CLOSED REDUCTION METACARPAL WITH PERCUTANEOUS PINNING (Left Finger)  Patient location during evaluation: PACU Anesthesia Type: General Level of consciousness: awake and alert and oriented Pain management: pain level controlled Vital Signs Assessment: post-procedure vital signs reviewed and stable Respiratory status: spontaneous breathing Cardiovascular status: blood pressure returned to baseline Anesthetic complications: no     Last Vitals:  Vitals:   12/10/16 1739 12/10/16 1744  BP: (!) 142/101 135/87  Pulse:    Resp:    Temp:    SpO2:      Last Pain:  Vitals:   12/10/16 1730  TempSrc: Temporal  PainSc:                  Vernona Peake

## 2016-12-10 NOTE — H&P (Signed)
Paper H&P to be scanned into permanent record. H&P reviewed and patient re-examined. No changes. 

## 2016-12-10 NOTE — Transfer of Care (Signed)
Immediate Anesthesia Transfer of Care Note  Patient: Angel Rice  Procedure(s) Performed: CLOSED REDUCTION METACARPAL WITH PERCUTANEOUS PINNING VS.ORIF (Left ) OPEN REDUCTION INTERNAL FIXATION (ORIF) METACARPAL (Left )  Patient Location: PACU  Anesthesia Type:General  Level of Consciousness: drowsy and patient cooperative  Airway & Oxygen Therapy: Patient Spontanous Breathing and Patient connected to face mask oxygen  Post-op Assessment: Report given to RN, Post -op Vital signs reviewed and stable and Patient moving all extremities X 4  Post vital signs: Reviewed and stable  Last Vitals:  Vitals:   12/10/16 1317 12/10/16 1624  BP: (!) 126/97 118/74  Pulse: 98 88  Resp: 16 (!) 9  Temp: 36.5 C 36.7 C  SpO2: 99% 91%    Last Pain:  Vitals:   12/10/16 1318  TempSrc:   PainSc: 2          Complications: No apparent anesthesia complications

## 2016-12-10 NOTE — Op Note (Signed)
12/10/2016  4:20 PM  Patient:   Lucilla LameBrandon J Sanger  Pre-Op Diagnosis:   Closed displaced oblique fracture of proximal phalanx, left ring finger.  Post-Op Diagnosis:   Same.  Procedure:   Closed reduction and percutaneous pinning of left ring P1 fracture.  Surgeon:   Maryagnes AmosJ. Jeffrey Octavia Mottola, MD  Assistant:   None  Anesthesia:   General LMA  Findings:   As above.  Complications:   None  Fluids:   650 cc crystalloid  EBL:   2 cc  UOP:   None  TT:   None  Drains:   None  Closure:   None  Implants:   0.045 K wires x3  Brief Clinical Note:   The patient is a 21 year old autistic male who sustained the above-noted injury several days ago when he apparently struck his hand against a closed door. X-rays in the emergency room demonstrated the above-noted injury. He presents at this time for definitive management of this injury.  Procedure:   The patient was brought into the operating room and laid in his general laryngeal mask anesthesia was obtained, the patient's left hand and upper extremity were prepped with ChloraPrep solution before being draped sterilely. Preoperative antibiotics were administered. A timeout was performed to verify the appropriate surgical site before the fracture was reduced using manipulation. The adequacy of reduction was verified utilizing FluoroScan imaging in AP and lateral projections and found to be near anatomic. The fracture was stabilized using three 0.045 K wires placed in an ulnar to radial direction. The adequacy of fracture fixation and pin position was verified utilizing FluoroScan imaging in AP and lateral projections and found to be excellent. The pins were cut at the level of the skin and pushed beneath the skin. After performing a digital block utilizing a total of 10 cc of 0.5% plain Sensorcaine to help with postoperative analgesia, a sterile bulky dressing was applied to the finger and the finger was buddy taped to the long finger. A bulky dressing was  then applied to the whole hand which was placed into a volar splint which extended to the fingertips. The patient was then awakened, extubated, and returned to the recovery room in satisfactory condition after tolerating the procedure well.

## 2016-12-10 NOTE — Discharge Instructions (Addendum)
AMBULATORY SURGERY  DISCHARGE INSTRUCTIONS   1) The drugs that you were given will stay in your system until tomorrow so for the next 24 hours you should not:  A) Drive an automobile B) Make any legal decisions C) Drink any alcoholic beverage   2) You may resume regular meals tomorrow.  Today it is better to start with liquids and gradually work up to solid foods.  You may eat anything you prefer, but it is better to start with liquids, then soup and crackers, and gradually work up to solid foods.   3) Please notify your doctor immediately if you have any unusual bleeding, trouble breathing, redness and pain at the surgery site, drainage, fever, or pain not relieved by medication. 4)   5) Your post-operative visit with Dr.                                     is: Date:                        Time:    Please call to schedule your post-operative visit.  6) Additional Instructions:    Keep splint dry and intact. Keep hand elevated above heart level. Apply ice to affected area frequently. Take ibuprofen 800 mg TID with meals for 7-10 days, then as necessary. Take pain medication as prescribed or ES Tylenol when needed.  Return for follow-up in 10-14 days or as scheduled.  AMBULATORY SURGERY  DISCHARGE INSTRUCTIONS   7) The drugs that you were given will stay in your system until tomorrow so for the next 24 hours you should not:  D) Drive an automobile E) Make any legal decisions F) Drink any alcoholic beverage   8) You may resume regular meals tomorrow.  Today it is better to start with liquids and gradually work up to solid foods.  You may eat anything you prefer, but it is better to start with liquids, then soup and crackers, and gradually work up to solid foods.   9) Please notify your doctor immediately if you have any unusual bleeding, trouble breathing, redness and pain at the surgery site, drainage, fever, or pain not relieved by medication.    10) Additional  Instructions:        Please contact your physician with any problems or Same Day Surgery at [phone], Monday through Friday 6 am to 4 pm, or [place] [facility] Main number at [phone].

## 2016-12-10 NOTE — Anesthesia Preprocedure Evaluation (Addendum)
Anesthesia Evaluation  Patient identified by MRN, date of birth, ID band Patient awake  General Assessment Comment:Autistic  Reviewed: Allergy & Precautions, NPO status , Patient's Chart, lab work & pertinent test results  History of Anesthesia Complications Negative for: history of anesthetic complications  Airway Mallampati: I       Dental  (+) Teeth Intact   Pulmonary neg pulmonary ROS, neg sleep apnea, neg COPD,    breath sounds clear to auscultation       Cardiovascular hypertension, (-) CAD, (-) Past MI and (-) Cardiac Stents  Rhythm:Regular Rate:Normal     Neuro/Psych PSYCHIATRIC DISORDERS Autism negative neurological ROS     GI/Hepatic negative GI ROS, Neg liver ROS,   Endo/Other  negative endocrine ROSneg diabetes  Renal/GU negative Renal ROS     Musculoskeletal negative musculoskeletal ROS (+)   Abdominal Normal abdominal exam  (+)   Peds  Hematology negative hematology ROS (+)   Anesthesia Other Findings Past Medical History: No date: Autism No date: Hypertension   Reproductive/Obstetrics                            Anesthesia Physical Anesthesia Plan  ASA: II  Anesthesia Plan: General   Post-op Pain Management:    Induction: Intravenous  PONV Risk Score and Plan: 1 and Dexamethasone and Ondansetron  Airway Management Planned: LMA  Additional Equipment:   Intra-op Plan:   Post-operative Plan:   Informed Consent: I have reviewed the patients History and Physical, chart, labs and discussed the procedure including the risks, benefits and alternatives for the proposed anesthesia with the patient or authorized representative who has indicated his/her understanding and acceptance.   Dental advisory given  Plan Discussed with: CRNA and Anesthesiologist  Anesthesia Plan Comments: (Consent obtained over phone from patient's legal guardian Honor Junesngela White)         Anesthesia Quick Evaluation

## 2016-12-10 NOTE — Anesthesia Procedure Notes (Addendum)
Procedure Name: LMA Insertion Date/Time: 12/10/2016 3:24 PM Performed by: Michaele OfferSavage, Liyla Radliff, CRNA Pre-anesthesia Checklist: Patient identified, Emergency Drugs available, Suction available, Patient being monitored and Timeout performed Patient Re-evaluated:Patient Re-evaluated prior to induction Oxygen Delivery Method: Circle system utilized Preoxygenation: Pre-oxygenation with 100% oxygen Induction Type: IV induction Ventilation: Mask ventilation without difficulty LMA: LMA inserted LMA Size: 4.5 Number of attempts: 1 Placement Confirmation: positive ETCO2 and breath sounds checked- equal and bilateral Tube secured with: Tape Dental Injury: Teeth and Oropharynx as per pre-operative assessment  Comments: Patient's lips extremely chapped, cracking, blood noticed in pre op,  Moisturizer applied

## 2016-12-10 NOTE — Anesthesia Post-op Follow-up Note (Signed)
Anesthesia QCDR form completed.        

## 2016-12-11 ENCOUNTER — Encounter: Payer: Self-pay | Admitting: Surgery

## 2016-12-17 DIAGNOSIS — Z79899 Other long term (current) drug therapy: Secondary | ICD-10-CM | POA: Diagnosis not present

## 2016-12-23 DIAGNOSIS — S6992XD Unspecified injury of left wrist, hand and finger(s), subsequent encounter: Secondary | ICD-10-CM | POA: Diagnosis not present

## 2016-12-24 DIAGNOSIS — Z79899 Other long term (current) drug therapy: Secondary | ICD-10-CM | POA: Diagnosis not present

## 2016-12-31 DIAGNOSIS — Z79899 Other long term (current) drug therapy: Secondary | ICD-10-CM | POA: Diagnosis not present

## 2017-01-07 DIAGNOSIS — Z79899 Other long term (current) drug therapy: Secondary | ICD-10-CM | POA: Diagnosis not present

## 2017-01-15 DIAGNOSIS — Z79899 Other long term (current) drug therapy: Secondary | ICD-10-CM | POA: Diagnosis not present

## 2017-01-20 DIAGNOSIS — S62615D Displaced fracture of proximal phalanx of left ring finger, subsequent encounter for fracture with routine healing: Secondary | ICD-10-CM | POA: Diagnosis not present

## 2017-01-22 DIAGNOSIS — G4733 Obstructive sleep apnea (adult) (pediatric): Secondary | ICD-10-CM | POA: Diagnosis not present

## 2017-01-22 DIAGNOSIS — R569 Unspecified convulsions: Secondary | ICD-10-CM | POA: Diagnosis not present

## 2017-01-22 DIAGNOSIS — Z79899 Other long term (current) drug therapy: Secondary | ICD-10-CM | POA: Diagnosis not present

## 2017-01-29 DIAGNOSIS — Z79899 Other long term (current) drug therapy: Secondary | ICD-10-CM | POA: Diagnosis not present

## 2017-02-04 DIAGNOSIS — Z79899 Other long term (current) drug therapy: Secondary | ICD-10-CM | POA: Diagnosis not present

## 2017-02-11 DIAGNOSIS — Z79899 Other long term (current) drug therapy: Secondary | ICD-10-CM | POA: Diagnosis not present

## 2017-02-13 ENCOUNTER — Inpatient Hospital Stay: Admission: RE | Admit: 2017-02-13 | Payer: Medicare Other | Source: Ambulatory Visit

## 2017-02-14 ENCOUNTER — Inpatient Hospital Stay: Admission: RE | Admit: 2017-02-14 | Payer: Medicare Other | Source: Ambulatory Visit

## 2017-02-17 ENCOUNTER — Encounter
Admission: RE | Admit: 2017-02-17 | Discharge: 2017-02-17 | Disposition: A | Discharge status: Home / Self Care | Payer: Medicare Other | Source: Ambulatory Visit | Attending: Surgery | Admitting: Surgery

## 2017-02-17 HISTORY — DX: Sleep apnea, unspecified: G47.30

## 2017-02-17 NOTE — Patient Instructions (Addendum)
  Your procedure is scheduled on: 02-20-17 THURSDAY Report to Same Day Surgery 2nd floor medical mall [facility](Medical Mall Entrance-take elevator on left to 2nd floor.  Check in with surgery information desk.) To find out your arrival time please call [phone] between 1PM - 3PM [facility]  Remember: Instructions that are not followed completely may result in serious medical risk, up to and including death, or upon the discretion of your surgeon and anesthesiologist your surgery may need to be rescheduled.    _x___ 1. Do not eat food after midnight the night before your procedure. NO GUM OR CANDY AFTER MIDNIGHT. You may drink clear liquids up to 2 hours before you are scheduled to arrive at the hospital for your procedure.  Do not drink clear liquids within 2 hours of your scheduled arrival to the hospital.  Clear liquids include  --Water or Apple juice without pulp  --Clear carbohydrate beverage such as ClearFast or Gatorade  --Black Coffee or Clear Tea (No milk, no creamers, do not add anything to the coffee or Tea    __x__ 2. No Alcohol for 24 hours before or after surgery.   __x__3. No Smoking for 24 prior to surgery.   ____  4. Bring all medications with you on the day of surgery if instructed.    __x__ 5. Notify your doctor if there is any change in your medical condition     (cold, fever, infections).     Do not wear jewelry, make-up, hairpins, clips or nail polish.  Do not wear lotions, powders, or perfumes. You may wear deodorant.  Do not shave 48 hours prior to surgery. Men may shave face and neck.  Do not bring valuables to the hospital.    [facility] any belongings or valuables.               Contacts, dentures or bridgework may not be worn into surgery.  Leave your suitcase in the car. After surgery it may be brought to your room.  For patients admitted to the hospital, discharge time is determined by your treatment team.   Patients  discharged the day of surgery will not be allowed to drive home.  You will need someone to drive you home and stay with you the night of your procedure.    _x___ TAKE THE FOLLOWING MEDICATION THE MORNING OF SURGERY WITH A SMALL SIP OF WATER. These include:  1. CLONIDINE  2. CLOZAPINE  3. FAMOTIDINE  4. LAMOTRIGINE  5. SERTRALINE  6. MAY TAKE HYDROCODONE AM OF SURGERY IF NEEDED  ____Fleets enema or Magnesium Citrate as directed.   ____ Use CHG Soap or sage wipes as directed on instruction sheet   ____ Use inhalers on the day of surgery and bring to hospital day of surgery  ____ Stop Metformin and Janumet 2 days prior to surgery.    ____ Take 1/2 of usual insulin dose the night before surgery and none on the morning     surgery.   ____ Follow recommendations from Cardiologist, Pulmonologist or PCP regarding stopping Aspirin, Coumadin, Plavix ,Eliquis, Effient, or Pradaxa, and Pletal.  X____Stop Anti-inflammatories such as Advil, Aleve, IBUPROFEN, Motrin, Naproxen, Naprosyn, Goodies powders or aspirin products NOW-OK to take Tylenol   ____ Stop supplements until after surgery   _X___ Bring C-Pap to the hospital.

## 2017-02-17 NOTE — Pre-Procedure Instructions (Signed)
SPOKE WITH KEITH FONDVILLE FROM [facility] REGARDING PTS MED LIST-KEITH TO FAX ME OVER MED LIST SO I CAN GO OVER SURGERY INSTRUCTIONS WITH HIM REGARDING WHAT Law NEEDS TO TAKE AM OF SURGERY
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
## 2017-02-18 DIAGNOSIS — Z79899 Other long term (current) drug therapy: Secondary | ICD-10-CM | POA: Diagnosis not present

## 2017-02-18 NOTE — Pre-Procedure Instructions (Signed)
CALLED Angel Rice FROM GROUP HOME REGARDING PT-HE DID RECEIVE THE DAY OF SURGERY INSTRUCTIONS AND I REVIEWED THEM VERBALLY OVER THE PHONE ALSO. Angel VERBALIZED UNDERSTANDING.  HE DID SAY THAT ANGELA WHITE FROM [employer] WILL BE HERE FOR Angel Rice'S SURGERY TO SIGN HIS CONSENTS .  HE GAVE ME ANGELA'S # AND I WILL CONTACT HER TO VERIFY THIS INFO THAT SHE WILL IN FACT BE HERE TO SIGN CONSENTS

## 2017-02-19 MED ORDER — CEFAZOLIN SODIUM-DEXTROSE 2-4 GM/100ML-% IV SOLN
2.0000 g | Freq: Once | INTRAVENOUS | Status: AC
  Administered 2017-02-20: 2 g via INTRAVENOUS

## 2017-02-20 ENCOUNTER — Encounter
Admission: RE | Disposition: A | Discharge status: Home / Self Care | Payer: Self-pay | Source: Ambulatory Visit | Attending: Surgery

## 2017-02-20 ENCOUNTER — Ambulatory Visit
Admission: RE | Admit: 2017-02-20 | Discharge: 2017-02-20 | Disposition: A | Discharge status: Home / Self Care | Payer: Medicare Other | Source: Ambulatory Visit | Attending: Surgery | Admitting: Surgery

## 2017-02-20 ENCOUNTER — Ambulatory Visit: Payer: Medicare Other | Admitting: Certified Registered"

## 2017-02-20 ENCOUNTER — Other Ambulatory Visit: Payer: Self-pay

## 2017-02-20 DIAGNOSIS — Z888 Allergy status to other drugs, medicaments and biological substances status: Secondary | ICD-10-CM | POA: Insufficient documentation

## 2017-02-20 DIAGNOSIS — F802 Mixed receptive-expressive language disorder: Secondary | ICD-10-CM | POA: Insufficient documentation

## 2017-02-20 DIAGNOSIS — F429 Obsessive-compulsive disorder, unspecified: Secondary | ICD-10-CM | POA: Insufficient documentation

## 2017-02-20 DIAGNOSIS — Z79899 Other long term (current) drug therapy: Secondary | ICD-10-CM | POA: Diagnosis not present

## 2017-02-20 DIAGNOSIS — E669 Obesity, unspecified: Secondary | ICD-10-CM | POA: Diagnosis not present

## 2017-02-20 DIAGNOSIS — Z882 Allergy status to sulfonamides status: Secondary | ICD-10-CM | POA: Insufficient documentation

## 2017-02-20 DIAGNOSIS — I10 Essential (primary) hypertension: Secondary | ICD-10-CM | POA: Diagnosis not present

## 2017-02-20 DIAGNOSIS — Z9989 Dependence on other enabling machines and devices: Secondary | ICD-10-CM | POA: Insufficient documentation

## 2017-02-20 DIAGNOSIS — X58XXXA Exposure to other specified factors, initial encounter: Secondary | ICD-10-CM | POA: Insufficient documentation

## 2017-02-20 DIAGNOSIS — R569 Unspecified convulsions: Secondary | ICD-10-CM | POA: Diagnosis not present

## 2017-02-20 DIAGNOSIS — G4730 Sleep apnea, unspecified: Secondary | ICD-10-CM | POA: Diagnosis not present

## 2017-02-20 DIAGNOSIS — G8918 Other acute postprocedural pain: Secondary | ICD-10-CM | POA: Diagnosis not present

## 2017-02-20 DIAGNOSIS — G4700 Insomnia, unspecified: Secondary | ICD-10-CM | POA: Insufficient documentation

## 2017-02-20 DIAGNOSIS — F840 Autistic disorder: Secondary | ICD-10-CM | POA: Diagnosis not present

## 2017-02-20 DIAGNOSIS — Z91011 Allergy to milk products: Secondary | ICD-10-CM | POA: Insufficient documentation

## 2017-02-20 DIAGNOSIS — S62615D Displaced fracture of proximal phalanx of left ring finger, subsequent encounter for fracture with routine healing: Secondary | ICD-10-CM | POA: Insufficient documentation

## 2017-02-20 DIAGNOSIS — Z6829 Body mass index (BMI) 29.0-29.9, adult: Secondary | ICD-10-CM | POA: Diagnosis not present

## 2017-02-20 DIAGNOSIS — Z881 Allergy status to other antibiotic agents status: Secondary | ICD-10-CM | POA: Diagnosis not present

## 2017-02-20 DIAGNOSIS — T8484XA Pain due to internal orthopedic prosthetic devices, implants and grafts, initial encounter: Secondary | ICD-10-CM | POA: Diagnosis not present

## 2017-02-20 DIAGNOSIS — Z472 Encounter for removal of internal fixation device: Secondary | ICD-10-CM | POA: Diagnosis not present

## 2017-02-20 DIAGNOSIS — S62615A Displaced fracture of proximal phalanx of left ring finger, initial encounter for closed fracture: Secondary | ICD-10-CM | POA: Diagnosis not present

## 2017-02-20 HISTORY — PX: HARDWARE REMOVAL: SHX979

## 2017-02-20 SURGERY — REMOVAL, HARDWARE
Anesthesia: General | Site: Finger | Laterality: Left | Wound class: Clean

## 2017-02-20 MED ORDER — LIDOCAINE HCL (CARDIAC) 20 MG/ML IV SOLN
INTRAVENOUS | Status: DC | PRN
  Administered 2017-02-20: 100 mg via INTRAVENOUS

## 2017-02-20 MED ORDER — PROPOFOL 10 MG/ML IV BOLUS
INTRAVENOUS | Status: AC
Start: 2017-02-20 — End: ?
  Filled 2017-02-20: qty 20

## 2017-02-20 MED ORDER — FENTANYL CITRATE (PF) 100 MCG/2ML IJ SOLN
INTRAMUSCULAR | Status: DC | PRN
  Administered 2017-02-20 (×2): 50 ug via INTRAVENOUS

## 2017-02-20 MED ORDER — ONDANSETRON HCL 4 MG/2ML IJ SOLN: 4.0000 mg | Freq: Once | INTRAMUSCULAR | Status: DC | PRN

## 2017-02-20 MED ORDER — BUPIVACAINE HCL (PF) 0.5 % IJ SOLN
INTRAMUSCULAR | Status: AC
  Filled 2017-02-20: qty 30

## 2017-02-20 MED ORDER — NEOMYCIN-POLYMYXIN B GU 40-200000 IR SOLN
Status: AC
  Filled 2017-02-20: qty 2

## 2017-02-20 MED ORDER — FENTANYL CITRATE (PF) 100 MCG/2ML IJ SOLN
INTRAMUSCULAR | Status: AC
  Filled 2017-02-20: qty 2

## 2017-02-20 MED ORDER — PROPOFOL 10 MG/ML IV BOLUS
INTRAVENOUS | Status: DC | PRN
  Administered 2017-02-20: 200 mg via INTRAVENOUS

## 2017-02-20 MED ORDER — BUPIVACAINE HCL (PF) 0.5 % IJ SOLN
INTRAMUSCULAR | Status: DC | PRN
  Administered 2017-02-20: 10 mL

## 2017-02-20 MED ORDER — IBUPROFEN 800 MG PO TABS: 800.0000 mg | ORAL_TABLET | Freq: Three times a day (TID) | ORAL | 0 refills | Status: AC | PRN

## 2017-02-20 MED ORDER — MIDAZOLAM HCL 2 MG/2ML IJ SOLN
INTRAMUSCULAR | Status: AC
  Filled 2017-02-20: qty 2

## 2017-02-20 MED ORDER — GLYCOPYRROLATE 0.2 MG/ML IJ SOLN
INTRAMUSCULAR | Status: DC | PRN
  Administered 2017-02-20: 0.2 mg via INTRAVENOUS

## 2017-02-20 MED ORDER — ONDANSETRON HCL 4 MG/2ML IJ SOLN
INTRAMUSCULAR | Status: DC | PRN
  Administered 2017-02-20: 4 mg via INTRAVENOUS

## 2017-02-20 MED ORDER — ONDANSETRON HCL 4 MG/2ML IJ SOLN
INTRAMUSCULAR | Status: AC
  Filled 2017-02-20: qty 2

## 2017-02-20 MED ORDER — LIDOCAINE HCL (PF) 2 % IJ SOLN
INTRAMUSCULAR | Status: AC
  Filled 2017-02-20: qty 10

## 2017-02-20 MED ORDER — FENTANYL CITRATE (PF) 100 MCG/2ML IJ SOLN: 25.0000 ug | INTRAMUSCULAR | Status: DC | PRN

## 2017-02-20 MED ORDER — LACTATED RINGERS IV SOLN
INTRAVENOUS | Status: DC
  Administered 2017-02-20: 13:00:00 via INTRAVENOUS

## 2017-02-20 MED ORDER — CEFAZOLIN SODIUM-DEXTROSE 2-4 GM/100ML-% IV SOLN
INTRAVENOUS | Status: AC
  Filled 2017-02-20: qty 100

## 2017-02-20 MED ORDER — ACETAMINOPHEN 10 MG/ML IV SOLN
INTRAVENOUS | Status: AC
  Filled 2017-02-20: qty 100

## 2017-02-20 MED ORDER — NEOMYCIN-POLYMYXIN B GU 40-200000 IR SOLN
Status: DC | PRN
  Administered 2017-02-20: 2 mL

## 2017-02-20 MED ORDER — ACETAMINOPHEN 10 MG/ML IV SOLN
INTRAVENOUS | Status: DC | PRN
  Administered 2017-02-20: 1000 mg via INTRAVENOUS

## 2017-02-20 SURGICAL SUPPLY — 33 items
BANDAGE ACE 4X5 VEL STRL LF (GAUZE/BANDAGES/DRESSINGS) ×2 IMPLANT
BNDG COHESIVE 4X5 TAN STRL (GAUZE/BANDAGES/DRESSINGS) ×2 IMPLANT
BNDG ESMARK 4X12 TAN STRL LF (GAUZE/BANDAGES/DRESSINGS) ×2 IMPLANT
CANISTER SUCT 1200ML W/VALVE (MISCELLANEOUS) ×2 IMPLANT
CHLORAPREP W/TINT 26ML (MISCELLANEOUS) ×4 IMPLANT
CORD BIP STRL DISP 12FT (MISCELLANEOUS) ×2 IMPLANT
CUFF TOURN 18 STER (MISCELLANEOUS) ×2 IMPLANT
CUFF TOURN 24 STER (MISCELLANEOUS) IMPLANT
DRAPE FLUOR MINI C-ARM 54X84 (DRAPES) ×2 IMPLANT
DRAPE INCISE IOBAN 66X45 STRL (DRAPES) IMPLANT
ELECT CAUTERY BLADE 6.4 (BLADE) ×2 IMPLANT
ELECT REM PT RETURN 9FT ADLT (ELECTROSURGICAL) ×2
ELECTRODE REM PT RTRN 9FT ADLT (ELECTROSURGICAL) ×1 IMPLANT
FORCEPS JEWEL BIP 4-3/4 STR (INSTRUMENTS) ×2 IMPLANT
GAUZE PETRO XEROFOAM 1X8 (MISCELLANEOUS) ×2 IMPLANT
GAUZE SPONGE 4X4 12PLY STRL (GAUZE/BANDAGES/DRESSINGS) ×2 IMPLANT
GLOVE BIO SURGEON STRL SZ8 (GLOVE) ×4 IMPLANT
GLOVE INDICATOR 8.0 STRL GRN (GLOVE) ×2 IMPLANT
GOWN STRL REUS W/ TWL LRG LVL3 (GOWN DISPOSABLE) ×2 IMPLANT
GOWN STRL REUS W/ TWL XL LVL3 (GOWN DISPOSABLE) ×1 IMPLANT
GOWN STRL REUS W/TWL LRG LVL3 (GOWN DISPOSABLE) ×2
GOWN STRL REUS W/TWL XL LVL3 (GOWN DISPOSABLE) ×1
KIT RM TURNOVER STRD PROC AR (KITS) ×2 IMPLANT
NEEDLE FILTER BLUNT 18X 1/2SAF (NEEDLE) ×1
NEEDLE FILTER BLUNT 18X1 1/2 (NEEDLE) ×1 IMPLANT
NS IRRIG 1000ML POUR BTL (IV SOLUTION) ×2 IMPLANT
PACK EXTREMITY ARMC (MISCELLANEOUS) ×2 IMPLANT
STAPLER SKIN PROX 35W (STAPLE) ×2 IMPLANT
STOCKINETTE M/LG 89821 (MISCELLANEOUS) ×2 IMPLANT
SUT PROLENE 4 0 PS 2 18 (SUTURE) ×2 IMPLANT
SUT VIC AB 2-0 SH 27 (SUTURE)
SUT VIC AB 2-0 SH 27XBRD (SUTURE) IMPLANT
SYR 10ML LL (SYRINGE) ×2 IMPLANT

## 2017-02-20 NOTE — Anesthesia Postprocedure Evaluation (Signed)
Anesthesia Post Note  Patient: Lucilla LameBrandon J Gruenwald  Procedure(s) Performed: HARDWARE REMOVAL PIN LEFT RING FINGER (Left Finger)  Patient location during evaluation: PACU Anesthesia Type: General Level of consciousness: awake and alert Pain management: pain level controlled Vital Signs Assessment: post-procedure vital signs reviewed and stable Respiratory status: spontaneous breathing, nonlabored ventilation, respiratory function stable and patient connected to nasal cannula oxygen Cardiovascular status: blood pressure returned to baseline and stable Postop Assessment: no apparent nausea or vomiting Anesthetic complications: no     Last Vitals:  Vitals:   02/20/17 1517 02/20/17 1531  BP: 117/80 121/87  Pulse:  96  Resp:  16  Temp: 36.5 C (!) 36.2 C  SpO2: 96% 97%    Last Pain:  Vitals:   02/20/17 1531  TempSrc: Tympanic  PainSc: 1                  Elna Radovich S

## 2017-02-20 NOTE — H&P (Signed)
Paper H&P to be scanned into permanent record. H&P reviewed and patient re-examined. No changes. 

## 2017-02-20 NOTE — Anesthesia Preprocedure Evaluation (Signed)
Anesthesia Evaluation  Patient identified by MRN, date of birth, ID band Patient awake    Reviewed: Allergy & Precautions, NPO status , Patient's Chart, lab work & pertinent test results, reviewed documented beta blocker date and time   Airway Mallampati: III  TM Distance: >3 FB     Dental  (+) Chipped   Pulmonary sleep apnea and Continuous Positive Airway Pressure Ventilation ,           Cardiovascular hypertension, Pt. on medications      Neuro/Psych Seizures -,  PSYCHIATRIC DISORDERS    GI/Hepatic   Endo/Other    Renal/GU      Musculoskeletal   Abdominal   Peds  Hematology   Anesthesia Other Findings Obese.  Reproductive/Obstetrics                             Anesthesia Physical Anesthesia Plan  ASA: III  Anesthesia Plan: General   Post-op Pain Management:    Induction: Intravenous  PONV Risk Score and Plan:   Airway Management Planned: LMA  Additional Equipment:   Intra-op Plan:   Post-operative Plan:   Informed Consent: I have reviewed the patients History and Physical, chart, labs and discussed the procedure including the risks, benefits and alternatives for the proposed anesthesia with the patient or authorized representative who has indicated his/her understanding and acceptance.     Plan Discussed with: CRNA  Anesthesia Plan Comments:         Anesthesia Quick Evaluation

## 2017-02-20 NOTE — Transfer of Care (Signed)
Immediate Anesthesia Transfer of Care Note  Patient: Angel Rice  Procedure(s) Performed: HARDWARE REMOVAL PIN LEFT RING FINGER (Left Finger)  Patient Location: PACU  Anesthesia Type:General  Level of Consciousness: sedated  Airway & Oxygen Therapy: Patient Spontanous Breathing and Patient connected to face mask oxygen  Post-op Assessment: Report given to RN and Post -op Vital signs reviewed and stable  Post vital signs: Reviewed and stable  Last Vitals:  Vitals:   02/20/17 1235 02/20/17 1418  BP: 117/86 117/81  Pulse: (!) 104 72  Resp: 18 11  Temp: 36.4 C   SpO2: 98% 100%    Last Pain:  Vitals:   02/20/17 1235  TempSrc: Tympanic         Complications: No apparent anesthesia complications

## 2017-02-20 NOTE — Op Note (Signed)
02/20/2017  2:18 PM  Patient:   Angel Rice  Pre-Op Diagnosis:   Retained pins x3 status post closed reduction and percutaneous pinning of displaced left ring P1 fracture.  Post-Op Diagnosis:   Same.  Procedure:   Removal of retained pins x3 left ring P1.  Surgeon:   Maryagnes AmosJ. Jeffrey Tani Virgo, MD  Assistant:   Coral SpikesJessie Hemric, PA-S  Anesthesia:   General LMA  Findings:   As above.  Complications:   None  Fluids:   200 cc crystalloid  EBL:   1 cc  UOP:   None  TT:   18 minutes at 250 mmHg.  Drains:   None  Closure:   4-0 Prolene interrupted sutures  Brief Clinical Note:   The patient is a 22 year old male who is now 2 months status post a closed reduction and percutaneous pinning of a displaced oblique fracture of his left ring proximal phalanx.  The patient has gone on to heal the fracture well, based on subsequent x-rays, and presents today for removal of his pins.  Procedure:   The patient was brought into the operating room and lain in the supine position.  After adequate general laryngeal mask anesthesia was obtained, the patient's left hand and upper extremity were prepped with ChloraPrep solution before being draped sterilely.  Preoperative antibiotics were administered.  The limb was exsanguinated with an Esmarch and the tourniquet inflated to 250 mmHg.  Two of the pins were readily palpable subcutaneously, so short stab incisions were made over each of them using a #15 blade and the pins removed easily.  The third pin was more difficult to palpate.  However, once it was located, a third short stab incision was made over this pin and the pin was removed without further difficulty.  The left ring finger was imaged in AP and lateral projections using the FluoroScan imager.  These films demonstrated that the fracture was well healed and well aligned, and that the pins were removed in their entireties.  Each of the two larger stab incisions were closed using 4-0 Prolene interrupted  sutures before a digital block was performed using 10 cc of 0.5% plain Sensorcaine.  A sterile bulky dressing was applied to the finger before the patient was awakened, extubated, and returned to the recovery room in satisfactory condition after tolerating the procedure well.

## 2017-02-20 NOTE — Anesthesia Procedure Notes (Signed)
Procedure Name: LMA Insertion Performed by: Katelen Luepke, CRNA Pre-anesthesia Checklist: Patient identified, Patient being monitored, Timeout performed, Emergency Drugs available and Suction available Patient Re-evaluated:Patient Re-evaluated prior to induction Oxygen Delivery Method: Circle system utilized Preoxygenation: Pre-oxygenation with 100% oxygen Induction Type: IV induction Ventilation: Mask ventilation without difficulty LMA: LMA inserted LMA Size: 4.5 Tube type: Oral Number of attempts: 1 Placement Confirmation: positive ETCO2 and breath sounds checked- equal and bilateral Tube secured with: Tape Dental Injury: Teeth and Oropharynx as per pre-operative assessment        

## 2017-02-20 NOTE — Anesthesia Post-op Follow-up Note (Signed)
Anesthesia QCDR form completed.        

## 2017-02-20 NOTE — Discharge Instructions (Addendum)
AMBULATORY SURGERY  DISCHARGE INSTRUCTIONS   1) The drugs that you were given will stay in your system until tomorrow so for the next 24 hours you should not:  A) Drive an automobile B) Make any legal decisions C) Drink any alcoholic beverage   2) You may resume regular meals tomorrow.  Today it is better to start with liquids and gradually work up to solid foods.  You may eat anything you prefer, but it is better to start with liquids, then soup and crackers, and gradually work up to solid foods.   3) Please notify your doctor immediately if you have any unusual bleeding, trouble breathing, redness and pain at the surgery site, drainage, fever, or pain not relieved by medication. 4)   5) Your post-operative visit with Dr.                                     is: Date:                        Time:    Please call to schedule your post-operative visit.  6) Additional Instructions:      Keep dressing dry and intact. Keep hand elevated above heart level. May shower after dressing removed on postop day 4 (Monday). Cover sutures with Band-Aids after drying off. Apply ice to affected area frequently. Take ibuprofen 800 mg every 8 hours with food as necessary for pain. May take ES Tylenol if needed.  Return for follow-up in 10-14 days or as scheduled.

## 2017-02-25 DIAGNOSIS — Z79899 Other long term (current) drug therapy: Secondary | ICD-10-CM | POA: Diagnosis not present

## 2017-02-26 DIAGNOSIS — F840 Autistic disorder: Secondary | ICD-10-CM | POA: Diagnosis not present

## 2017-03-04 DIAGNOSIS — Z79899 Other long term (current) drug therapy: Secondary | ICD-10-CM | POA: Diagnosis not present

## 2017-03-11 DIAGNOSIS — Z79899 Other long term (current) drug therapy: Secondary | ICD-10-CM | POA: Diagnosis not present

## 2017-03-18 DIAGNOSIS — Z79899 Other long term (current) drug therapy: Secondary | ICD-10-CM | POA: Diagnosis not present

## 2017-03-26 DIAGNOSIS — Z79899 Other long term (current) drug therapy: Secondary | ICD-10-CM | POA: Diagnosis not present

## 2017-04-02 DIAGNOSIS — Z79899 Other long term (current) drug therapy: Secondary | ICD-10-CM | POA: Diagnosis not present

## 2017-04-08 DIAGNOSIS — Z79899 Other long term (current) drug therapy: Secondary | ICD-10-CM | POA: Diagnosis not present

## 2017-04-15 DIAGNOSIS — Z79899 Other long term (current) drug therapy: Secondary | ICD-10-CM | POA: Diagnosis not present

## 2017-04-22 DIAGNOSIS — Z79899 Other long term (current) drug therapy: Secondary | ICD-10-CM | POA: Diagnosis not present

## 2017-04-29 DIAGNOSIS — Z79899 Other long term (current) drug therapy: Secondary | ICD-10-CM | POA: Diagnosis not present

## 2017-05-06 DIAGNOSIS — Z79899 Other long term (current) drug therapy: Secondary | ICD-10-CM | POA: Diagnosis not present

## 2017-05-13 DIAGNOSIS — Z79899 Other long term (current) drug therapy: Secondary | ICD-10-CM | POA: Diagnosis not present

## 2017-05-14 DIAGNOSIS — Z0001 Encounter for general adult medical examination with abnormal findings: Secondary | ICD-10-CM | POA: Diagnosis not present

## 2017-05-14 DIAGNOSIS — E782 Mixed hyperlipidemia: Secondary | ICD-10-CM | POA: Diagnosis not present

## 2017-05-14 DIAGNOSIS — G4730 Sleep apnea, unspecified: Secondary | ICD-10-CM | POA: Diagnosis not present

## 2017-05-14 DIAGNOSIS — F840 Autistic disorder: Secondary | ICD-10-CM | POA: Diagnosis not present

## 2017-05-14 DIAGNOSIS — F250 Schizoaffective disorder, bipolar type: Secondary | ICD-10-CM | POA: Diagnosis not present

## 2017-05-14 DIAGNOSIS — Z6830 Body mass index (BMI) 30.0-30.9, adult: Secondary | ICD-10-CM | POA: Diagnosis not present

## 2017-05-14 DIAGNOSIS — Z0000 Encounter for general adult medical examination without abnormal findings: Secondary | ICD-10-CM | POA: Diagnosis not present

## 2017-05-20 DIAGNOSIS — Z79899 Other long term (current) drug therapy: Secondary | ICD-10-CM | POA: Diagnosis not present

## 2017-05-22 DIAGNOSIS — F840 Autistic disorder: Secondary | ICD-10-CM | POA: Diagnosis not present

## 2017-05-27 DIAGNOSIS — Z79899 Other long term (current) drug therapy: Secondary | ICD-10-CM | POA: Diagnosis not present

## 2017-06-03 DIAGNOSIS — Z79899 Other long term (current) drug therapy: Secondary | ICD-10-CM | POA: Diagnosis not present

## 2017-06-10 DIAGNOSIS — Z79899 Other long term (current) drug therapy: Secondary | ICD-10-CM | POA: Diagnosis not present

## 2017-06-18 DIAGNOSIS — Z79899 Other long term (current) drug therapy: Secondary | ICD-10-CM | POA: Diagnosis not present

## 2017-06-24 DIAGNOSIS — Z79899 Other long term (current) drug therapy: Secondary | ICD-10-CM | POA: Diagnosis not present

## 2017-07-01 DIAGNOSIS — Z79899 Other long term (current) drug therapy: Secondary | ICD-10-CM | POA: Diagnosis not present

## 2017-07-08 DIAGNOSIS — Z79899 Other long term (current) drug therapy: Secondary | ICD-10-CM | POA: Diagnosis not present

## 2017-07-15 DIAGNOSIS — Z79899 Other long term (current) drug therapy: Secondary | ICD-10-CM | POA: Diagnosis not present

## 2017-07-22 DIAGNOSIS — Z79899 Other long term (current) drug therapy: Secondary | ICD-10-CM | POA: Diagnosis not present

## 2017-07-29 DIAGNOSIS — Z79899 Other long term (current) drug therapy: Secondary | ICD-10-CM | POA: Diagnosis not present

## 2017-07-31 DIAGNOSIS — Z0000 Encounter for general adult medical examination without abnormal findings: Secondary | ICD-10-CM | POA: Diagnosis not present

## 2017-08-05 DIAGNOSIS — Z79899 Other long term (current) drug therapy: Secondary | ICD-10-CM | POA: Diagnosis not present

## 2017-08-13 DIAGNOSIS — Z79899 Other long term (current) drug therapy: Secondary | ICD-10-CM | POA: Diagnosis not present

## 2017-08-19 DIAGNOSIS — Z79899 Other long term (current) drug therapy: Secondary | ICD-10-CM | POA: Diagnosis not present

## 2017-08-27 DIAGNOSIS — Z79899 Other long term (current) drug therapy: Secondary | ICD-10-CM | POA: Diagnosis not present

## 2017-08-27 DIAGNOSIS — F840 Autistic disorder: Secondary | ICD-10-CM | POA: Diagnosis not present

## 2017-09-02 DIAGNOSIS — Z79899 Other long term (current) drug therapy: Secondary | ICD-10-CM | POA: Diagnosis not present

## 2017-09-09 DIAGNOSIS — Z79899 Other long term (current) drug therapy: Secondary | ICD-10-CM | POA: Diagnosis not present

## 2017-09-17 DIAGNOSIS — Z79899 Other long term (current) drug therapy: Secondary | ICD-10-CM | POA: Diagnosis not present

## 2017-09-23 DIAGNOSIS — Z79899 Other long term (current) drug therapy: Secondary | ICD-10-CM | POA: Diagnosis not present

## 2017-09-30 DIAGNOSIS — Z79899 Other long term (current) drug therapy: Secondary | ICD-10-CM | POA: Diagnosis not present

## 2017-10-14 IMAGING — CT CT HEAD W/O CM
3 series · 15 of 47 positions shown, 18 images · non-contrast
Comparison: None.

CLINICAL DATA: Seizures.

EXAM:
CT HEAD WITHOUT CONTRAST
TECHNIQUE: Contiguous axial images were obtained from the base of the skull
through the vertex without intravenous contrast.

[Series 2: head wo · axial · 0.44mm/px · z∈[-153,-28]mm · 9 of 30 slices shown, 12 images]
[im 3/30  brain]
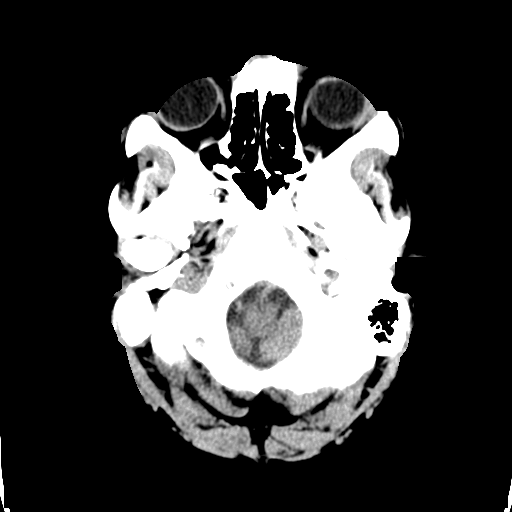
[im 3/30  bone]
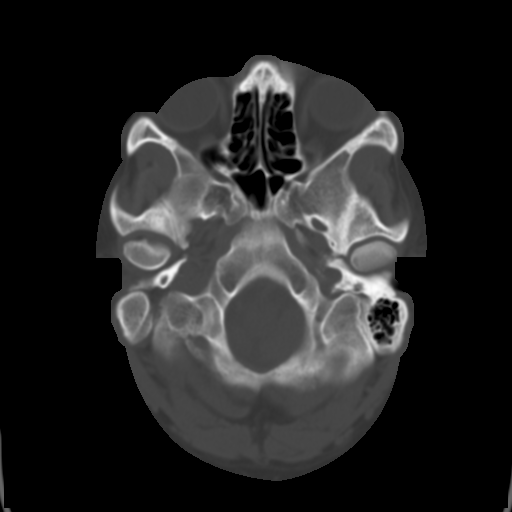
[im 6/30  brain]
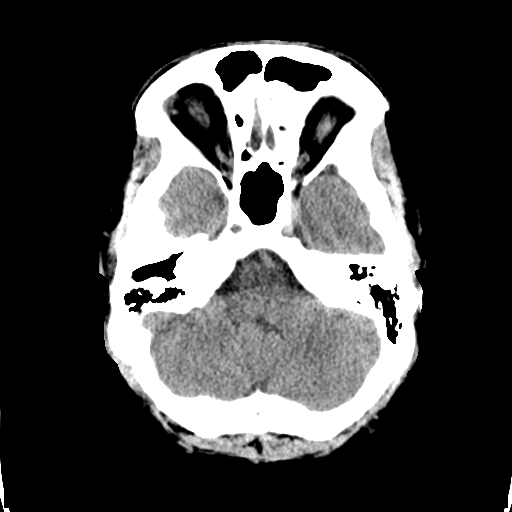
[im 9/30  brain]
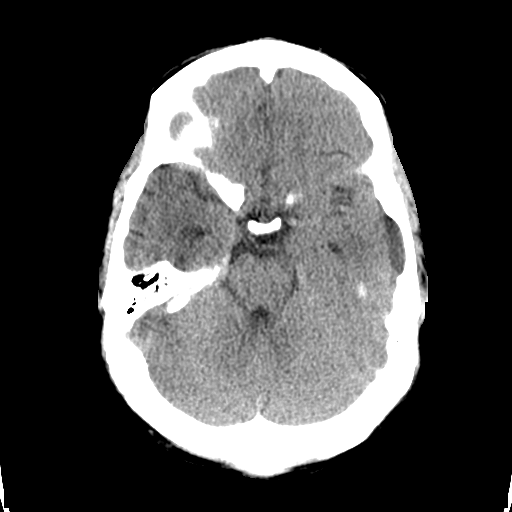
[im 12/30  brain]
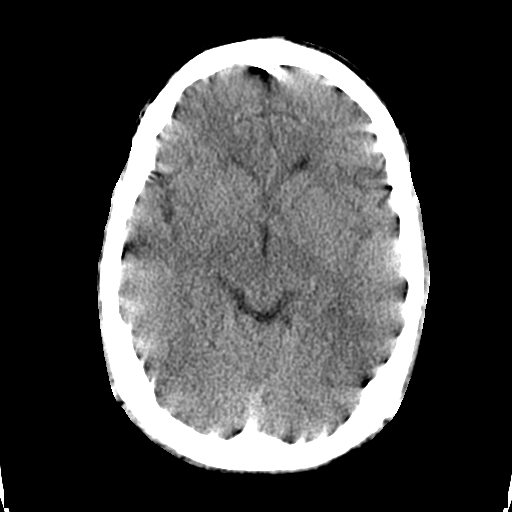
[im 16/30  brain]
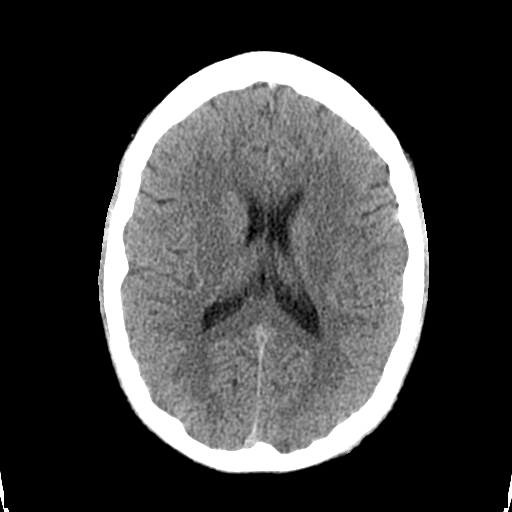
[im 16/30  bone]
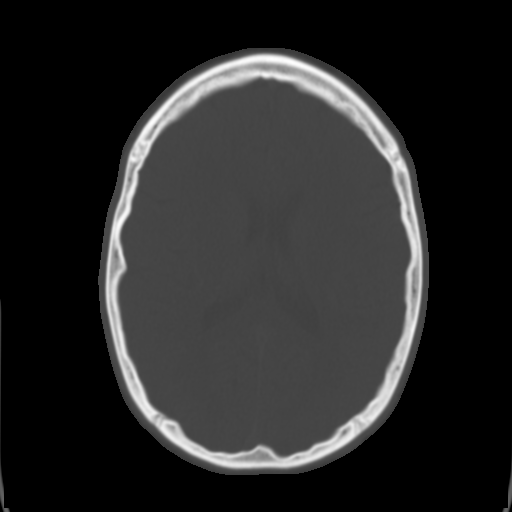
[im 19/30  brain]
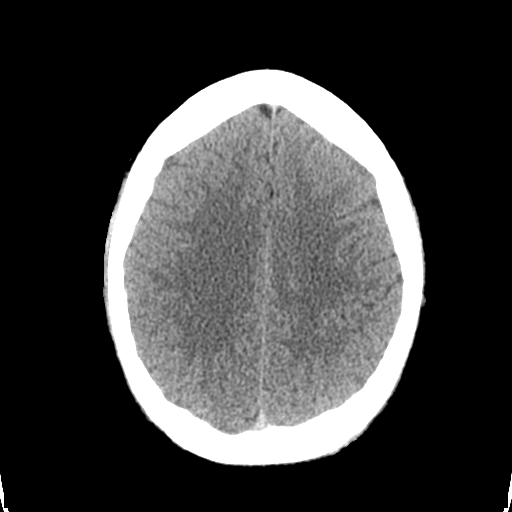
[im 22/30  brain]
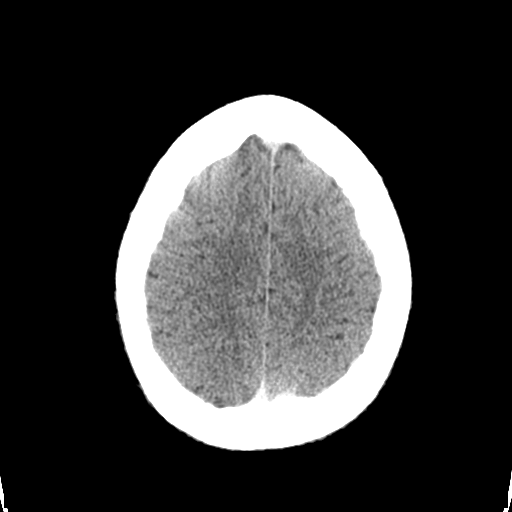
[im 25/30  brain]
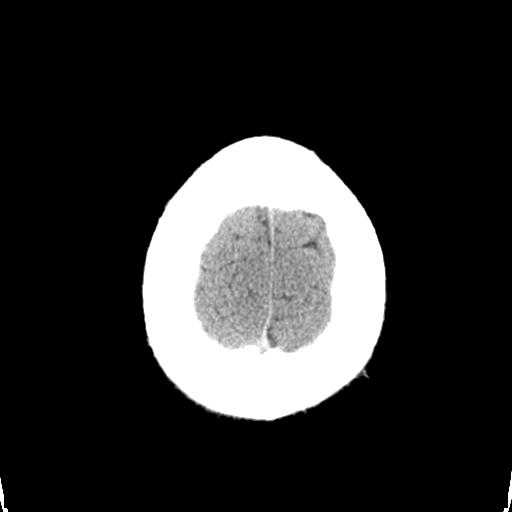
[im 28/30  brain]
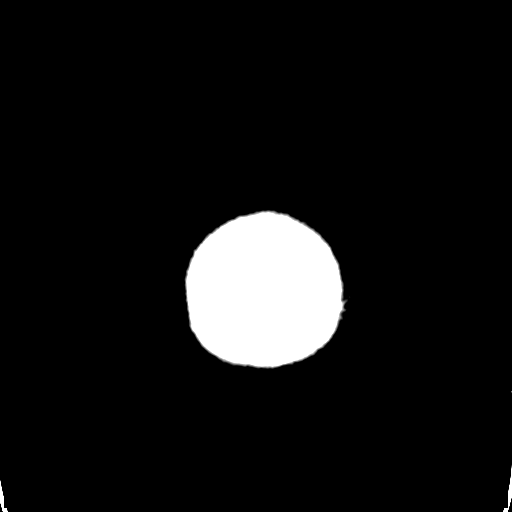
[im 28/30  bone]
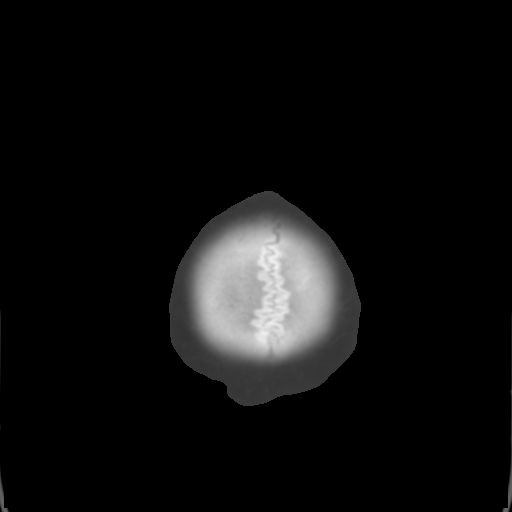

[Series 4: coronal soft tissue · coronal · 0.32mm/px · 3 of 63 slices shown]
[im 21/63  brain]
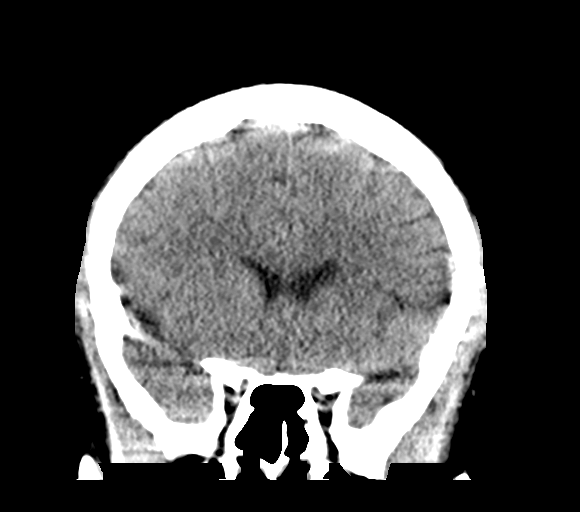
[im 28/63  brain]
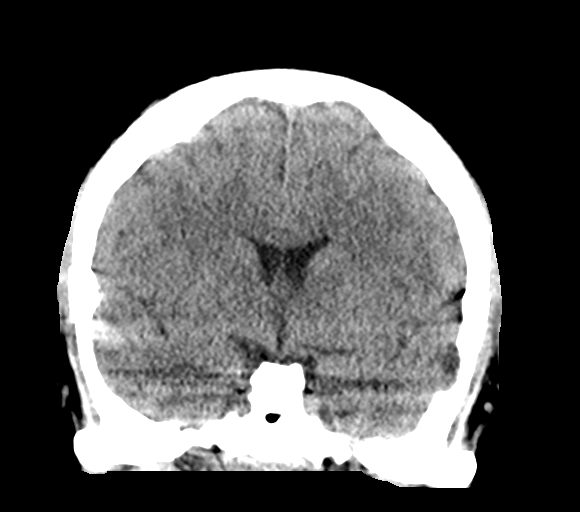
[im 35/63  brain]
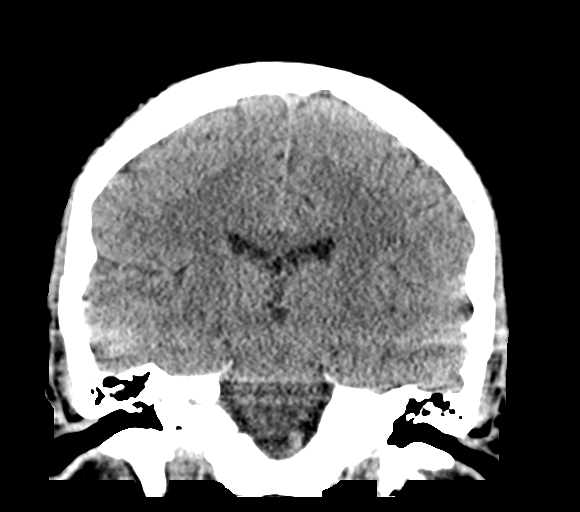

[Series 5: sagittal soft tissue · sagittal · 0.31mm/px · 3 of 51 slices shown]
[im 17/51  brain]
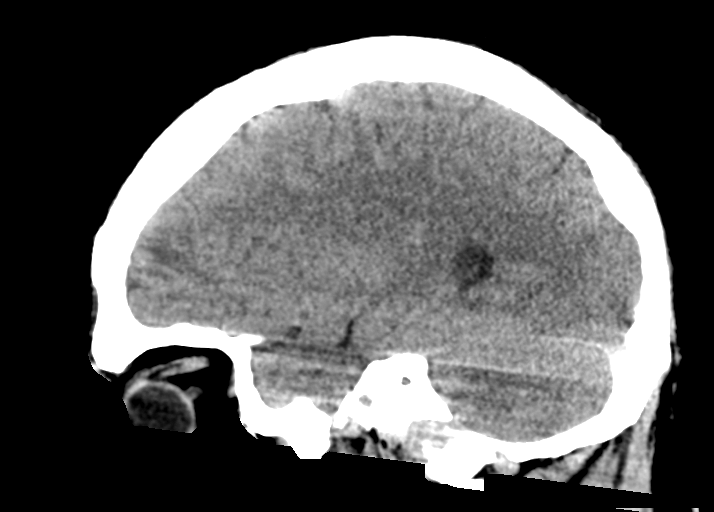
[im 26/51  brain]
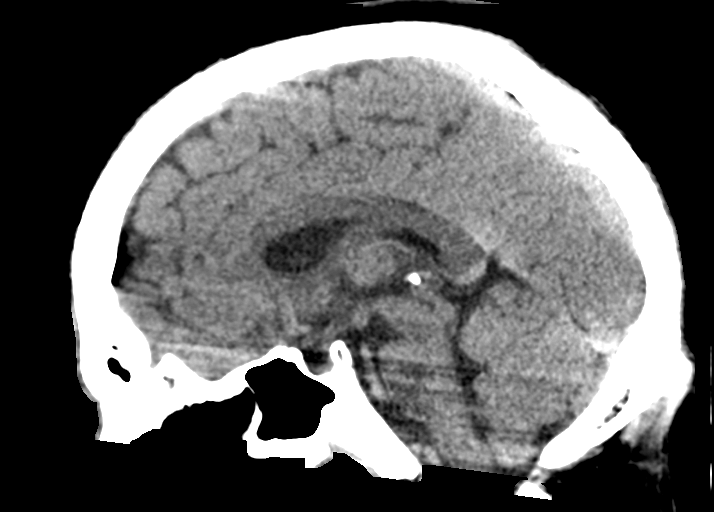
[im 34/51  brain]
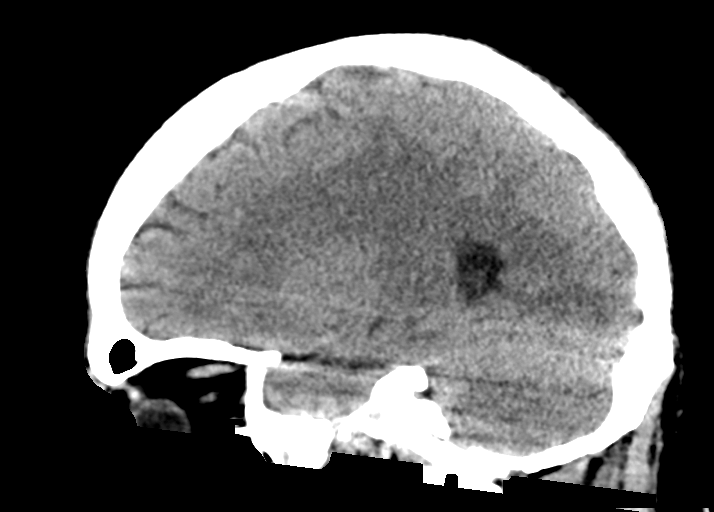

[15 of 47 positions shown; findings below may reference images not displayed]

FINDINGS: Brain: No evidence of acute infarction, hemorrhage, hydrocephalus,
extra-axial collection or mass lesion/mass effect.

Vascular: No hyperdense vessel or unexpected calcification.

Skull: No osseous abnormality.

Sinuses/Orbits: Visualized paranasal sinuses are clear. Visualized
mastoid sinuses are clear. Visualized orbits demonstrate no focal
abnormality.

Other: None
IMPRESSION: No acute intracranial pathology.

## 2017-10-15 DIAGNOSIS — Z79899 Other long term (current) drug therapy: Secondary | ICD-10-CM | POA: Diagnosis not present

## 2017-10-21 DIAGNOSIS — Z79899 Other long term (current) drug therapy: Secondary | ICD-10-CM | POA: Diagnosis not present

## 2017-10-28 DIAGNOSIS — Z79899 Other long term (current) drug therapy: Secondary | ICD-10-CM | POA: Diagnosis not present

## 2017-11-04 DIAGNOSIS — R569 Unspecified convulsions: Secondary | ICD-10-CM | POA: Diagnosis not present

## 2017-11-04 DIAGNOSIS — Z79899 Other long term (current) drug therapy: Secondary | ICD-10-CM | POA: Diagnosis not present

## 2017-11-04 DIAGNOSIS — F840 Autistic disorder: Secondary | ICD-10-CM | POA: Diagnosis not present

## 2017-11-04 DIAGNOSIS — G4733 Obstructive sleep apnea (adult) (pediatric): Secondary | ICD-10-CM | POA: Diagnosis not present

## 2017-11-06 DIAGNOSIS — R569 Unspecified convulsions: Secondary | ICD-10-CM | POA: Diagnosis not present

## 2017-11-11 DIAGNOSIS — Z79899 Other long term (current) drug therapy: Secondary | ICD-10-CM | POA: Diagnosis not present

## 2017-11-12 DIAGNOSIS — E782 Mixed hyperlipidemia: Secondary | ICD-10-CM | POA: Diagnosis not present

## 2017-11-12 DIAGNOSIS — Z23 Encounter for immunization: Secondary | ICD-10-CM | POA: Diagnosis not present

## 2017-11-12 DIAGNOSIS — Z6830 Body mass index (BMI) 30.0-30.9, adult: Secondary | ICD-10-CM | POA: Diagnosis not present

## 2017-11-12 DIAGNOSIS — F840 Autistic disorder: Secondary | ICD-10-CM | POA: Diagnosis not present

## 2017-11-12 DIAGNOSIS — G4730 Sleep apnea, unspecified: Secondary | ICD-10-CM | POA: Diagnosis not present

## 2017-11-12 DIAGNOSIS — F72 Severe intellectual disabilities: Secondary | ICD-10-CM | POA: Diagnosis not present

## 2017-11-12 DIAGNOSIS — J301 Allergic rhinitis due to pollen: Secondary | ICD-10-CM | POA: Diagnosis not present

## 2017-11-12 DIAGNOSIS — R635 Abnormal weight gain: Secondary | ICD-10-CM | POA: Diagnosis not present

## 2017-11-18 DIAGNOSIS — Z79899 Other long term (current) drug therapy: Secondary | ICD-10-CM | POA: Diagnosis not present

## 2017-11-25 DIAGNOSIS — Z79899 Other long term (current) drug therapy: Secondary | ICD-10-CM | POA: Diagnosis not present

## 2017-11-26 DIAGNOSIS — F840 Autistic disorder: Secondary | ICD-10-CM | POA: Diagnosis not present

## 2017-12-09 DIAGNOSIS — Z79899 Other long term (current) drug therapy: Secondary | ICD-10-CM | POA: Diagnosis not present

## 2017-12-15 DIAGNOSIS — Z79899 Other long term (current) drug therapy: Secondary | ICD-10-CM | POA: Diagnosis not present

## 2017-12-23 DIAGNOSIS — Z79899 Other long term (current) drug therapy: Secondary | ICD-10-CM | POA: Diagnosis not present

## 2017-12-30 DIAGNOSIS — Z79899 Other long term (current) drug therapy: Secondary | ICD-10-CM | POA: Diagnosis not present

## 2018-01-06 DIAGNOSIS — Z79899 Other long term (current) drug therapy: Secondary | ICD-10-CM | POA: Diagnosis not present

## 2018-01-15 DIAGNOSIS — J069 Acute upper respiratory infection, unspecified: Secondary | ICD-10-CM | POA: Diagnosis not present

## 2018-01-15 DIAGNOSIS — R634 Abnormal weight loss: Secondary | ICD-10-CM | POA: Diagnosis not present

## 2018-01-20 DIAGNOSIS — Z79899 Other long term (current) drug therapy: Secondary | ICD-10-CM | POA: Diagnosis not present

## 2018-01-23 DIAGNOSIS — G4730 Sleep apnea, unspecified: Secondary | ICD-10-CM | POA: Diagnosis not present

## 2018-01-23 DIAGNOSIS — G40909 Epilepsy, unspecified, not intractable, without status epilepticus: Secondary | ICD-10-CM | POA: Diagnosis not present

## 2018-01-23 DIAGNOSIS — F250 Schizoaffective disorder, bipolar type: Secondary | ICD-10-CM | POA: Diagnosis not present

## 2018-01-23 DIAGNOSIS — F840 Autistic disorder: Secondary | ICD-10-CM | POA: Diagnosis not present

## 2018-01-23 DIAGNOSIS — F650 Fetishism: Secondary | ICD-10-CM | POA: Diagnosis not present

## 2018-01-23 DIAGNOSIS — Z6829 Body mass index (BMI) 29.0-29.9, adult: Secondary | ICD-10-CM | POA: Diagnosis not present

## 2018-01-23 DIAGNOSIS — D529 Folate deficiency anemia, unspecified: Secondary | ICD-10-CM | POA: Diagnosis not present

## 2018-01-23 DIAGNOSIS — D630 Anemia in neoplastic disease: Secondary | ICD-10-CM | POA: Diagnosis not present

## 2018-01-28 DIAGNOSIS — Z79899 Other long term (current) drug therapy: Secondary | ICD-10-CM | POA: Diagnosis not present

## 2018-02-11 DIAGNOSIS — Z79899 Other long term (current) drug therapy: Secondary | ICD-10-CM | POA: Diagnosis not present

## 2018-02-12 DIAGNOSIS — F72 Severe intellectual disabilities: Secondary | ICD-10-CM | POA: Diagnosis not present

## 2018-02-12 DIAGNOSIS — F250 Schizoaffective disorder, bipolar type: Secondary | ICD-10-CM | POA: Diagnosis not present

## 2018-02-12 DIAGNOSIS — J301 Allergic rhinitis due to pollen: Secondary | ICD-10-CM | POA: Diagnosis not present

## 2018-02-17 DIAGNOSIS — Z79899 Other long term (current) drug therapy: Secondary | ICD-10-CM | POA: Diagnosis not present

## 2018-02-24 DIAGNOSIS — Z79899 Other long term (current) drug therapy: Secondary | ICD-10-CM | POA: Diagnosis not present

## 2018-03-03 DIAGNOSIS — Z79899 Other long term (current) drug therapy: Secondary | ICD-10-CM | POA: Diagnosis not present

## 2018-03-10 DIAGNOSIS — Z79899 Other long term (current) drug therapy: Secondary | ICD-10-CM | POA: Diagnosis not present

## 2018-03-24 DIAGNOSIS — Z79899 Other long term (current) drug therapy: Secondary | ICD-10-CM | POA: Diagnosis not present

## 2018-03-26 DIAGNOSIS — F840 Autistic disorder: Secondary | ICD-10-CM | POA: Diagnosis not present

## 2018-04-09 DIAGNOSIS — Z79899 Other long term (current) drug therapy: Secondary | ICD-10-CM | POA: Diagnosis not present

## 2018-04-22 DIAGNOSIS — Z79899 Other long term (current) drug therapy: Secondary | ICD-10-CM | POA: Diagnosis not present

## 2018-05-05 DIAGNOSIS — Z79899 Other long term (current) drug therapy: Secondary | ICD-10-CM | POA: Diagnosis not present

## 2018-05-19 DIAGNOSIS — Z79899 Other long term (current) drug therapy: Secondary | ICD-10-CM | POA: Diagnosis not present

## 2018-05-20 DIAGNOSIS — R569 Unspecified convulsions: Secondary | ICD-10-CM | POA: Diagnosis not present

## 2018-05-20 DIAGNOSIS — F840 Autistic disorder: Secondary | ICD-10-CM | POA: Diagnosis not present

## 2018-05-20 DIAGNOSIS — G4733 Obstructive sleep apnea (adult) (pediatric): Secondary | ICD-10-CM | POA: Diagnosis not present

## 2018-06-02 DIAGNOSIS — Z79899 Other long term (current) drug therapy: Secondary | ICD-10-CM | POA: Diagnosis not present

## 2018-06-03 DIAGNOSIS — F801 Expressive language disorder: Secondary | ICD-10-CM | POA: Diagnosis not present

## 2018-06-03 DIAGNOSIS — F72 Severe intellectual disabilities: Secondary | ICD-10-CM | POA: Diagnosis not present

## 2018-06-03 DIAGNOSIS — G4730 Sleep apnea, unspecified: Secondary | ICD-10-CM | POA: Diagnosis not present

## 2018-06-03 DIAGNOSIS — F250 Schizoaffective disorder, bipolar type: Secondary | ICD-10-CM | POA: Diagnosis not present

## 2018-06-03 DIAGNOSIS — Z6830 Body mass index (BMI) 30.0-30.9, adult: Secondary | ICD-10-CM | POA: Diagnosis not present

## 2018-06-03 DIAGNOSIS — E782 Mixed hyperlipidemia: Secondary | ICD-10-CM | POA: Diagnosis not present

## 2018-06-03 DIAGNOSIS — D649 Anemia, unspecified: Secondary | ICD-10-CM | POA: Diagnosis not present

## 2018-06-03 DIAGNOSIS — G40909 Epilepsy, unspecified, not intractable, without status epilepticus: Secondary | ICD-10-CM | POA: Diagnosis not present

## 2018-06-03 DIAGNOSIS — F650 Fetishism: Secondary | ICD-10-CM | POA: Diagnosis not present

## 2018-06-03 DIAGNOSIS — R0981 Nasal congestion: Secondary | ICD-10-CM | POA: Diagnosis not present

## 2018-06-03 DIAGNOSIS — E785 Hyperlipidemia, unspecified: Secondary | ICD-10-CM | POA: Diagnosis not present

## 2018-06-03 DIAGNOSIS — D529 Folate deficiency anemia, unspecified: Secondary | ICD-10-CM | POA: Diagnosis not present

## 2018-06-03 DIAGNOSIS — F840 Autistic disorder: Secondary | ICD-10-CM | POA: Diagnosis not present

## 2018-06-16 DIAGNOSIS — Z79899 Other long term (current) drug therapy: Secondary | ICD-10-CM | POA: Diagnosis not present

## 2018-06-30 DIAGNOSIS — Z79899 Other long term (current) drug therapy: Secondary | ICD-10-CM | POA: Diagnosis not present

## 2018-07-14 DIAGNOSIS — Z79899 Other long term (current) drug therapy: Secondary | ICD-10-CM | POA: Diagnosis not present

## 2018-07-15 DIAGNOSIS — F840 Autistic disorder: Secondary | ICD-10-CM | POA: Diagnosis not present

## 2018-07-28 DIAGNOSIS — Z79899 Other long term (current) drug therapy: Secondary | ICD-10-CM | POA: Diagnosis not present

## 2018-08-25 DIAGNOSIS — Z79899 Other long term (current) drug therapy: Secondary | ICD-10-CM | POA: Diagnosis not present

## 2018-09-19 IMAGING — CR DG HAND COMPLETE 3+V*L*
1 series · 4 of 4 positions shown · non-contrast
Comparison: None.

CLINICAL DATA: Hip fourth digit against door today with pain and
deformity, initial encounter

EXAM:
LEFT HAND - COMPLETE 3+ VIEW

[Series 1: dg hand complete left · 0.14mm/px · 4 of 4 slices shown]
[im 1/4]
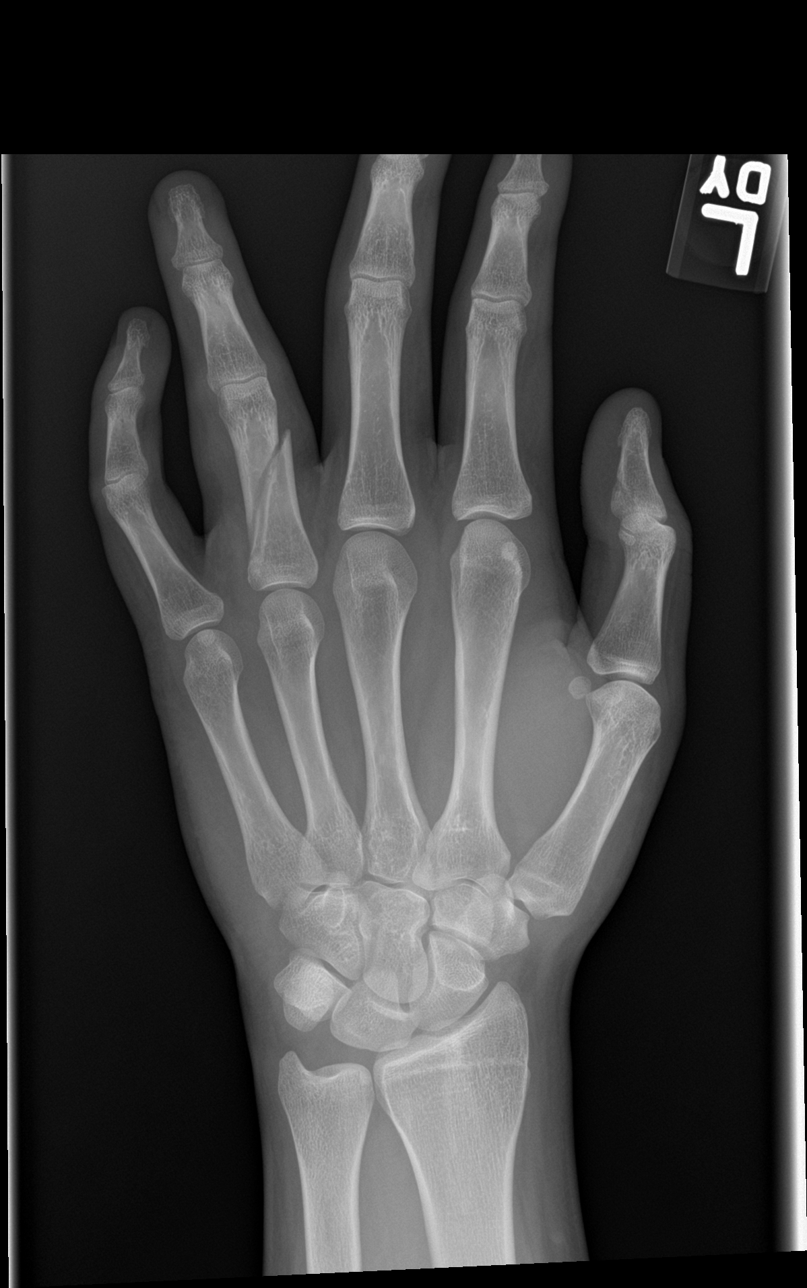
[im 2/4]
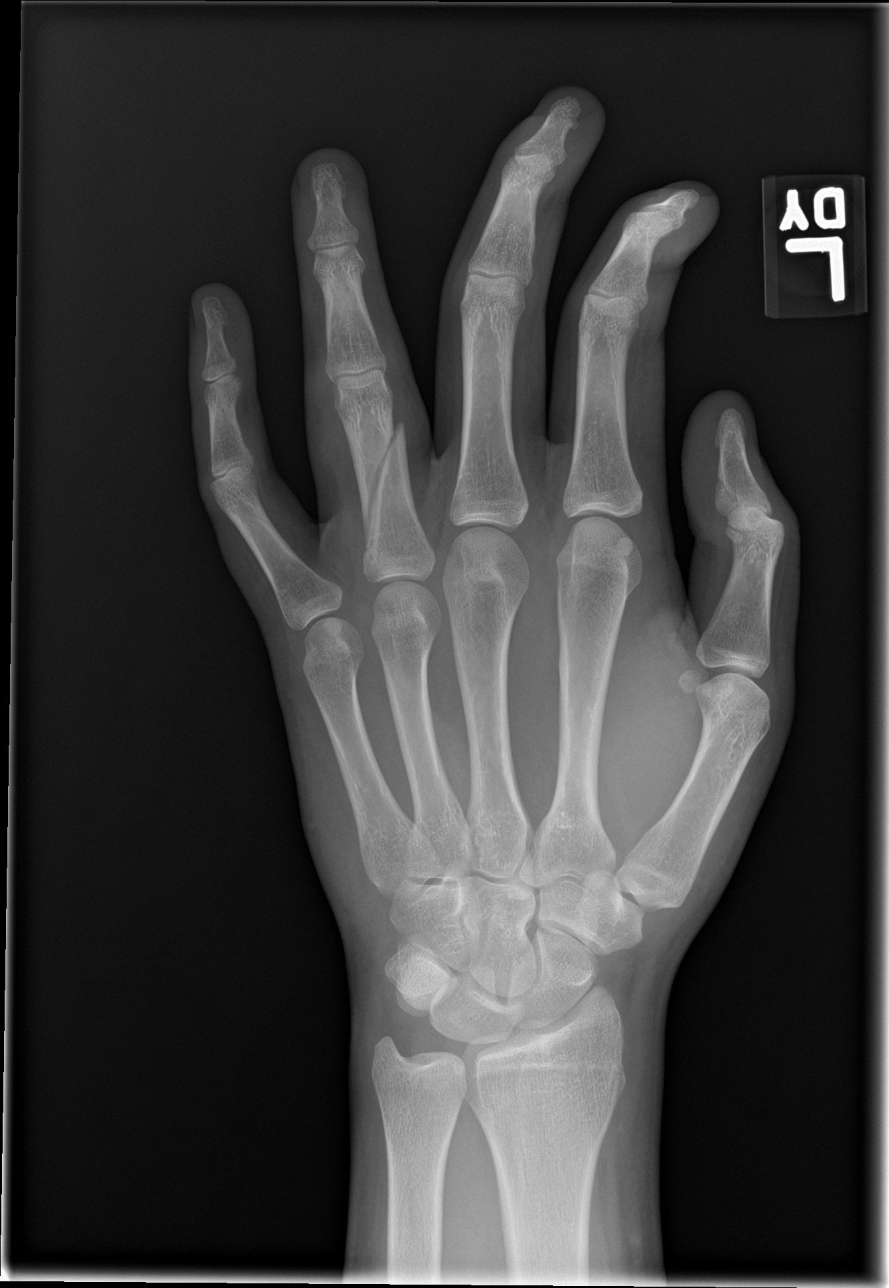
[im 3/4]
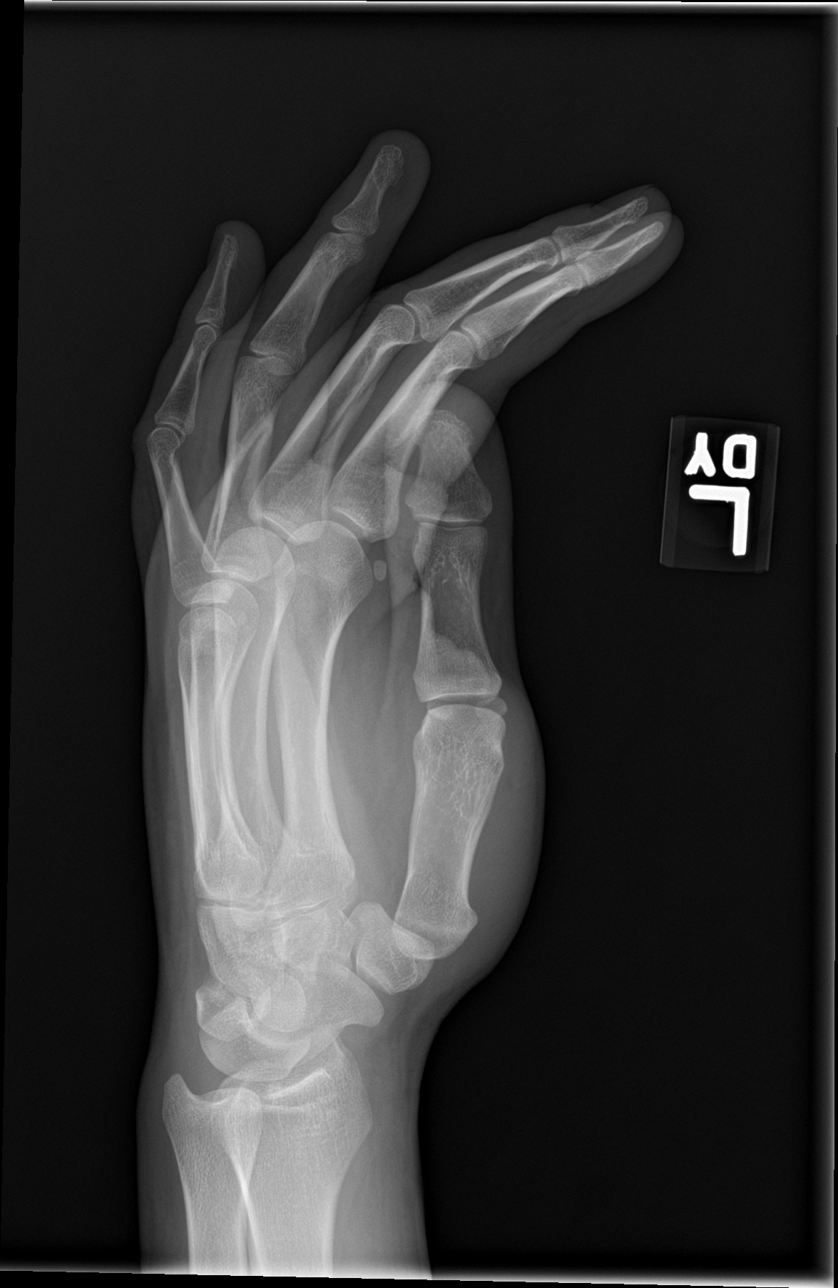
[im 4/4]
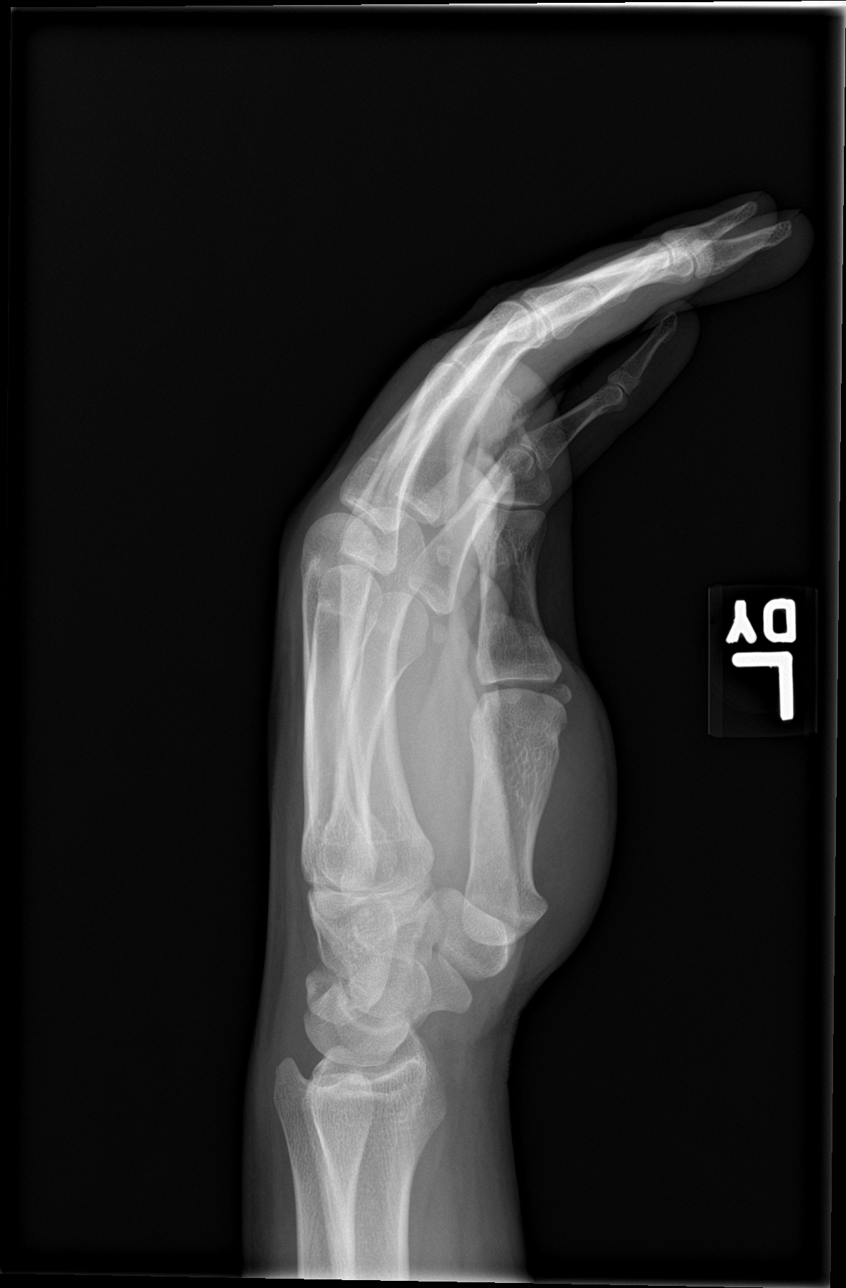

[4 of 4 positions shown; findings below may reference images not displayed]

FINDINGS: There is an oblique fracture through the fourth proximal phalanx
without involvement of the articular surface. Mild soft tissue
swelling is noted.
IMPRESSION: Fourth proximal phalangeal fracture

## 2018-09-24 DIAGNOSIS — Z79899 Other long term (current) drug therapy: Secondary | ICD-10-CM | POA: Diagnosis not present

## 2018-10-20 DIAGNOSIS — Z79899 Other long term (current) drug therapy: Secondary | ICD-10-CM | POA: Diagnosis not present

## 2018-11-03 DIAGNOSIS — F840 Autistic disorder: Secondary | ICD-10-CM | POA: Diagnosis not present

## 2018-12-21 DIAGNOSIS — Z79899 Other long term (current) drug therapy: Secondary | ICD-10-CM | POA: Diagnosis not present

## 2018-12-24 DIAGNOSIS — E785 Hyperlipidemia, unspecified: Secondary | ICD-10-CM | POA: Diagnosis not present

## 2018-12-24 DIAGNOSIS — F809 Developmental disorder of speech and language, unspecified: Secondary | ICD-10-CM | POA: Diagnosis not present

## 2018-12-24 DIAGNOSIS — I10 Essential (primary) hypertension: Secondary | ICD-10-CM | POA: Diagnosis not present

## 2019-01-19 DIAGNOSIS — Z79899 Other long term (current) drug therapy: Secondary | ICD-10-CM | POA: Diagnosis not present

## 2019-01-26 DIAGNOSIS — G40909 Epilepsy, unspecified, not intractable, without status epilepticus: Secondary | ICD-10-CM | POA: Diagnosis not present

## 2019-01-26 DIAGNOSIS — F840 Autistic disorder: Secondary | ICD-10-CM | POA: Diagnosis not present

## 2019-01-26 DIAGNOSIS — F72 Severe intellectual disabilities: Secondary | ICD-10-CM | POA: Diagnosis not present

## 2019-01-26 DIAGNOSIS — G4730 Sleep apnea, unspecified: Secondary | ICD-10-CM | POA: Diagnosis not present

## 2019-01-26 DIAGNOSIS — Z6830 Body mass index (BMI) 30.0-30.9, adult: Secondary | ICD-10-CM | POA: Diagnosis not present

## 2019-01-26 DIAGNOSIS — E6609 Other obesity due to excess calories: Secondary | ICD-10-CM | POA: Diagnosis not present

## 2019-01-26 DIAGNOSIS — E782 Mixed hyperlipidemia: Secondary | ICD-10-CM | POA: Diagnosis not present

## 2019-01-27 DIAGNOSIS — F840 Autistic disorder: Secondary | ICD-10-CM | POA: Diagnosis not present

## 2019-02-15 DIAGNOSIS — Z79899 Other long term (current) drug therapy: Secondary | ICD-10-CM | POA: Diagnosis not present

## 2019-03-16 DIAGNOSIS — Z79899 Other long term (current) drug therapy: Secondary | ICD-10-CM | POA: Diagnosis not present

## 2019-04-13 DIAGNOSIS — Z79899 Other long term (current) drug therapy: Secondary | ICD-10-CM | POA: Diagnosis not present

## 2019-04-28 DIAGNOSIS — F840 Autistic disorder: Secondary | ICD-10-CM | POA: Diagnosis not present

## 2019-05-11 DIAGNOSIS — Z0000 Encounter for general adult medical examination without abnormal findings: Secondary | ICD-10-CM | POA: Diagnosis not present

## 2019-05-13 DIAGNOSIS — Z79899 Other long term (current) drug therapy: Secondary | ICD-10-CM | POA: Diagnosis not present

## 2019-05-15 DIAGNOSIS — Z03818 Encounter for observation for suspected exposure to other biological agents ruled out: Secondary | ICD-10-CM | POA: Diagnosis not present

## 2019-05-15 DIAGNOSIS — Z20828 Contact with and (suspected) exposure to other viral communicable diseases: Secondary | ICD-10-CM | POA: Diagnosis not present

## 2019-07-08 DIAGNOSIS — Z79899 Other long term (current) drug therapy: Secondary | ICD-10-CM | POA: Diagnosis not present

## 2019-08-04 DIAGNOSIS — Z79899 Other long term (current) drug therapy: Secondary | ICD-10-CM | POA: Diagnosis not present

## 2019-08-19 DIAGNOSIS — F840 Autistic disorder: Secondary | ICD-10-CM | POA: Diagnosis not present

## 2019-09-01 DIAGNOSIS — Z79899 Other long term (current) drug therapy: Secondary | ICD-10-CM | POA: Diagnosis not present

## 2019-09-09 DIAGNOSIS — F428 Other obsessive-compulsive disorder: Secondary | ICD-10-CM | POA: Diagnosis not present

## 2019-09-09 DIAGNOSIS — G4730 Sleep apnea, unspecified: Secondary | ICD-10-CM | POA: Diagnosis not present

## 2019-09-09 DIAGNOSIS — E785 Hyperlipidemia, unspecified: Secondary | ICD-10-CM | POA: Diagnosis not present

## 2019-09-23 DIAGNOSIS — R569 Unspecified convulsions: Secondary | ICD-10-CM | POA: Diagnosis not present

## 2019-09-29 DIAGNOSIS — Z79899 Other long term (current) drug therapy: Secondary | ICD-10-CM | POA: Diagnosis not present

## 2019-10-27 DIAGNOSIS — Z79899 Other long term (current) drug therapy: Secondary | ICD-10-CM | POA: Diagnosis not present

## 2019-11-18 DIAGNOSIS — F840 Autistic disorder: Secondary | ICD-10-CM | POA: Diagnosis not present

## 2019-11-26 DIAGNOSIS — Z79899 Other long term (current) drug therapy: Secondary | ICD-10-CM | POA: Diagnosis not present

## 2019-12-22 DIAGNOSIS — Z79899 Other long term (current) drug therapy: Secondary | ICD-10-CM | POA: Diagnosis not present

## 2020-01-19 DIAGNOSIS — Z79899 Other long term (current) drug therapy: Secondary | ICD-10-CM | POA: Diagnosis not present

## 2020-01-26 DIAGNOSIS — R569 Unspecified convulsions: Secondary | ICD-10-CM | POA: Diagnosis not present

## 2020-02-14 DIAGNOSIS — Z79899 Other long term (current) drug therapy: Secondary | ICD-10-CM | POA: Diagnosis not present

## 2020-02-17 DIAGNOSIS — F840 Autistic disorder: Secondary | ICD-10-CM | POA: Diagnosis not present

## 2020-02-22 DIAGNOSIS — F840 Autistic disorder: Secondary | ICD-10-CM | POA: Diagnosis not present

## 2020-02-22 DIAGNOSIS — E785 Hyperlipidemia, unspecified: Secondary | ICD-10-CM | POA: Diagnosis not present

## 2020-02-22 DIAGNOSIS — E781 Pure hyperglyceridemia: Secondary | ICD-10-CM | POA: Diagnosis not present

## 2020-02-22 DIAGNOSIS — G4700 Insomnia, unspecified: Secondary | ICD-10-CM | POA: Diagnosis not present

## 2020-02-22 DIAGNOSIS — F429 Obsessive-compulsive disorder, unspecified: Secondary | ICD-10-CM | POA: Diagnosis not present

## 2020-02-22 DIAGNOSIS — G4730 Sleep apnea, unspecified: Secondary | ICD-10-CM | POA: Diagnosis not present

## 2020-02-22 DIAGNOSIS — F801 Expressive language disorder: Secondary | ICD-10-CM | POA: Diagnosis not present

## 2020-02-22 DIAGNOSIS — F428 Other obsessive-compulsive disorder: Secondary | ICD-10-CM | POA: Diagnosis not present

## 2020-02-22 DIAGNOSIS — F809 Developmental disorder of speech and language, unspecified: Secondary | ICD-10-CM | POA: Diagnosis not present

## 2020-02-22 DIAGNOSIS — R7303 Prediabetes: Secondary | ICD-10-CM | POA: Diagnosis not present

## 2020-02-22 DIAGNOSIS — G40909 Epilepsy, unspecified, not intractable, without status epilepticus: Secondary | ICD-10-CM | POA: Diagnosis not present

## 2020-03-15 DIAGNOSIS — Z79899 Other long term (current) drug therapy: Secondary | ICD-10-CM | POA: Diagnosis not present

## 2020-04-11 DIAGNOSIS — Z0000 Encounter for general adult medical examination without abnormal findings: Secondary | ICD-10-CM | POA: Diagnosis not present

## 2020-04-11 DIAGNOSIS — E6609 Other obesity due to excess calories: Secondary | ICD-10-CM | POA: Diagnosis not present

## 2020-04-11 DIAGNOSIS — I10 Essential (primary) hypertension: Secondary | ICD-10-CM | POA: Diagnosis not present

## 2020-04-11 DIAGNOSIS — Z6828 Body mass index (BMI) 28.0-28.9, adult: Secondary | ICD-10-CM | POA: Diagnosis not present

## 2020-04-12 DIAGNOSIS — Z79899 Other long term (current) drug therapy: Secondary | ICD-10-CM | POA: Diagnosis not present

## 2020-05-10 DIAGNOSIS — Z79899 Other long term (current) drug therapy: Secondary | ICD-10-CM | POA: Diagnosis not present

## 2020-05-18 DIAGNOSIS — F840 Autistic disorder: Secondary | ICD-10-CM | POA: Diagnosis not present

## 2020-06-07 DIAGNOSIS — Z79899 Other long term (current) drug therapy: Secondary | ICD-10-CM | POA: Diagnosis not present

## 2020-07-05 DIAGNOSIS — Z79899 Other long term (current) drug therapy: Secondary | ICD-10-CM | POA: Diagnosis not present

## 2020-07-13 DIAGNOSIS — G40001 Localization-related (focal) (partial) idiopathic epilepsy and epileptic syndromes with seizures of localized onset, not intractable, with status epilepticus: Secondary | ICD-10-CM | POA: Diagnosis not present

## 2020-07-13 DIAGNOSIS — F809 Developmental disorder of speech and language, unspecified: Secondary | ICD-10-CM | POA: Diagnosis not present

## 2020-07-13 DIAGNOSIS — I10 Essential (primary) hypertension: Secondary | ICD-10-CM | POA: Diagnosis not present

## 2020-07-13 DIAGNOSIS — E785 Hyperlipidemia, unspecified: Secondary | ICD-10-CM | POA: Diagnosis not present

## 2020-07-13 DIAGNOSIS — G40909 Epilepsy, unspecified, not intractable, without status epilepticus: Secondary | ICD-10-CM | POA: Diagnosis not present

## 2020-07-13 DIAGNOSIS — F250 Schizoaffective disorder, bipolar type: Secondary | ICD-10-CM | POA: Diagnosis not present

## 2020-07-26 DIAGNOSIS — F840 Autistic disorder: Secondary | ICD-10-CM | POA: Diagnosis not present

## 2020-07-26 DIAGNOSIS — G40009 Localization-related (focal) (partial) idiopathic epilepsy and epileptic syndromes with seizures of localized onset, not intractable, without status epilepticus: Secondary | ICD-10-CM | POA: Diagnosis not present

## 2020-07-26 DIAGNOSIS — G4733 Obstructive sleep apnea (adult) (pediatric): Secondary | ICD-10-CM | POA: Diagnosis not present

## 2020-07-26 DIAGNOSIS — R41841 Cognitive communication deficit: Secondary | ICD-10-CM | POA: Diagnosis not present

## 2020-07-26 DIAGNOSIS — R488 Other symbolic dysfunctions: Secondary | ICD-10-CM | POA: Diagnosis not present

## 2020-08-02 DIAGNOSIS — Z79899 Other long term (current) drug therapy: Secondary | ICD-10-CM | POA: Diagnosis not present

## 2020-08-16 DIAGNOSIS — F840 Autistic disorder: Secondary | ICD-10-CM | POA: Diagnosis not present

## 2020-08-31 DIAGNOSIS — Z79899 Other long term (current) drug therapy: Secondary | ICD-10-CM | POA: Diagnosis not present

## 2020-09-27 DIAGNOSIS — Z79899 Other long term (current) drug therapy: Secondary | ICD-10-CM | POA: Diagnosis not present

## 2020-10-25 DIAGNOSIS — Z79899 Other long term (current) drug therapy: Secondary | ICD-10-CM | POA: Diagnosis not present

## 2020-11-02 DIAGNOSIS — F840 Autistic disorder: Secondary | ICD-10-CM | POA: Diagnosis not present

## 2020-11-22 DIAGNOSIS — Z79899 Other long term (current) drug therapy: Secondary | ICD-10-CM | POA: Diagnosis not present

## 2020-12-20 DIAGNOSIS — Z79899 Other long term (current) drug therapy: Secondary | ICD-10-CM | POA: Diagnosis not present

## 2021-01-16 DIAGNOSIS — Z79899 Other long term (current) drug therapy: Secondary | ICD-10-CM | POA: Diagnosis not present

## 2021-01-23 DIAGNOSIS — Z79899 Other long term (current) drug therapy: Secondary | ICD-10-CM | POA: Diagnosis not present

## 2021-02-06 DIAGNOSIS — F840 Autistic disorder: Secondary | ICD-10-CM | POA: Diagnosis not present

## 2021-02-12 DIAGNOSIS — Z79899 Other long term (current) drug therapy: Secondary | ICD-10-CM | POA: Diagnosis not present

## 2021-03-10 DIAGNOSIS — R58 Hemorrhage, not elsewhere classified: Secondary | ICD-10-CM | POA: Diagnosis not present

## 2021-03-10 DIAGNOSIS — Z881 Allergy status to other antibiotic agents status: Secondary | ICD-10-CM | POA: Diagnosis not present

## 2021-03-10 DIAGNOSIS — Z882 Allergy status to sulfonamides status: Secondary | ICD-10-CM | POA: Diagnosis not present

## 2021-03-10 DIAGNOSIS — F840 Autistic disorder: Secondary | ICD-10-CM | POA: Diagnosis not present

## 2021-03-10 DIAGNOSIS — I10 Essential (primary) hypertension: Secondary | ICD-10-CM | POA: Diagnosis not present

## 2021-03-10 DIAGNOSIS — R531 Weakness: Secondary | ICD-10-CM | POA: Diagnosis not present

## 2021-03-10 DIAGNOSIS — R000 Tachycardia, unspecified: Secondary | ICD-10-CM | POA: Diagnosis not present

## 2021-03-10 DIAGNOSIS — G40909 Epilepsy, unspecified, not intractable, without status epilepticus: Secondary | ICD-10-CM | POA: Diagnosis not present

## 2021-03-10 DIAGNOSIS — R569 Unspecified convulsions: Secondary | ICD-10-CM | POA: Diagnosis not present

## 2021-03-12 DIAGNOSIS — Z79899 Other long term (current) drug therapy: Secondary | ICD-10-CM | POA: Diagnosis not present

## 2021-03-16 DIAGNOSIS — R569 Unspecified convulsions: Secondary | ICD-10-CM | POA: Diagnosis not present

## 2021-03-16 DIAGNOSIS — F801 Expressive language disorder: Secondary | ICD-10-CM | POA: Diagnosis not present

## 2021-03-16 DIAGNOSIS — J301 Allergic rhinitis due to pollen: Secondary | ICD-10-CM | POA: Diagnosis not present

## 2021-03-16 DIAGNOSIS — I10 Essential (primary) hypertension: Secondary | ICD-10-CM | POA: Diagnosis not present

## 2021-03-16 DIAGNOSIS — F840 Autistic disorder: Secondary | ICD-10-CM | POA: Diagnosis not present

## 2021-03-16 DIAGNOSIS — F809 Developmental disorder of speech and language, unspecified: Secondary | ICD-10-CM | POA: Diagnosis not present

## 2021-03-16 DIAGNOSIS — G4730 Sleep apnea, unspecified: Secondary | ICD-10-CM | POA: Diagnosis not present

## 2021-03-27 DIAGNOSIS — E559 Vitamin D deficiency, unspecified: Secondary | ICD-10-CM | POA: Diagnosis not present

## 2021-03-27 DIAGNOSIS — R000 Tachycardia, unspecified: Secondary | ICD-10-CM | POA: Diagnosis not present

## 2021-03-27 DIAGNOSIS — R569 Unspecified convulsions: Secondary | ICD-10-CM | POA: Diagnosis not present

## 2021-03-27 DIAGNOSIS — E038 Other specified hypothyroidism: Secondary | ICD-10-CM | POA: Diagnosis not present

## 2021-03-27 DIAGNOSIS — E612 Magnesium deficiency: Secondary | ICD-10-CM | POA: Diagnosis not present

## 2021-03-27 DIAGNOSIS — E538 Deficiency of other specified B group vitamins: Secondary | ICD-10-CM | POA: Diagnosis not present

## 2021-03-28 ENCOUNTER — Other Ambulatory Visit: Payer: Self-pay | Admitting: Student

## 2021-03-28 ENCOUNTER — Other Ambulatory Visit (HOSPITAL_COMMUNITY): Payer: Self-pay | Admitting: Student

## 2021-03-28 DIAGNOSIS — R569 Unspecified convulsions: Secondary | ICD-10-CM

## 2021-04-04 ENCOUNTER — Ambulatory Visit: Payer: Medicare Other

## 2021-04-09 DIAGNOSIS — Z79899 Other long term (current) drug therapy: Secondary | ICD-10-CM | POA: Diagnosis not present

## 2021-04-13 ENCOUNTER — Ambulatory Visit
Admission: RE | Admit: 2021-04-13 | Discharge: 2021-04-13 | Disposition: A | Discharge status: Home / Self Care | Payer: Medicare Other | Source: Ambulatory Visit | Attending: Student | Admitting: Student

## 2021-04-13 ENCOUNTER — Other Ambulatory Visit: Payer: Self-pay

## 2021-04-13 DIAGNOSIS — R569 Unspecified convulsions: Secondary | ICD-10-CM | POA: Insufficient documentation

## 2021-04-18 DIAGNOSIS — I471 Supraventricular tachycardia: Secondary | ICD-10-CM | POA: Diagnosis not present

## 2021-04-18 DIAGNOSIS — R000 Tachycardia, unspecified: Secondary | ICD-10-CM | POA: Diagnosis not present

## 2021-05-08 DIAGNOSIS — Z79899 Other long term (current) drug therapy: Secondary | ICD-10-CM | POA: Diagnosis not present

## 2021-05-08 DIAGNOSIS — F840 Autistic disorder: Secondary | ICD-10-CM | POA: Diagnosis not present

## 2021-05-09 DIAGNOSIS — R000 Tachycardia, unspecified: Secondary | ICD-10-CM | POA: Diagnosis not present

## 2021-05-09 DIAGNOSIS — I471 Supraventricular tachycardia: Secondary | ICD-10-CM | POA: Diagnosis not present

## 2021-06-05 DIAGNOSIS — Z79899 Other long term (current) drug therapy: Secondary | ICD-10-CM | POA: Diagnosis not present

## 2021-06-28 ENCOUNTER — Emergency Department: Payer: Medicare Other

## 2021-06-28 ENCOUNTER — Other Ambulatory Visit: Payer: Self-pay

## 2021-06-28 ENCOUNTER — Emergency Department
Admission: EM | Admit: 2021-06-28 | Discharge: 2021-06-29 | Disposition: A | Discharge status: Home / Self Care | Payer: Medicare Other | Attending: Emergency Medicine | Admitting: Emergency Medicine

## 2021-06-28 DIAGNOSIS — W19XXXA Unspecified fall, initial encounter: Secondary | ICD-10-CM | POA: Diagnosis not present

## 2021-06-28 DIAGNOSIS — I10 Essential (primary) hypertension: Secondary | ICD-10-CM | POA: Diagnosis not present

## 2021-06-28 DIAGNOSIS — R569 Unspecified convulsions: Secondary | ICD-10-CM | POA: Insufficient documentation

## 2021-06-28 DIAGNOSIS — R000 Tachycardia, unspecified: Secondary | ICD-10-CM | POA: Insufficient documentation

## 2021-06-28 LAB — CBC
HCT: 45.6 % (ref 39.0–52.0)
Hemoglobin: 14.3 g/dL (ref 13.0–17.0)
MCH: 27.5 pg (ref 26.0–34.0)
MCHC: 31.4 g/dL (ref 30.0–36.0)
MCV: 87.7 fL (ref 80.0–100.0)
Platelets: 289 10*3/uL (ref 150–400)
RBC: 5.2 MIL/uL (ref 4.22–5.81)
RDW: 13.2 % (ref 11.5–15.5)
WBC: 6.4 10*3/uL (ref 4.0–10.5)
nRBC: 0 % (ref 0.0–0.2)

## 2021-06-28 LAB — BASIC METABOLIC PANEL
Anion gap: 5 (ref 5–15)
BUN: 9 mg/dL (ref 6–20)
CO2: 28 mmol/L (ref 22–32)
Calcium: 9.5 mg/dL (ref 8.9–10.3)
Chloride: 107 mmol/L (ref 98–111)
Creatinine, Ser: 1.01 mg/dL (ref 0.61–1.24)
GFR, Estimated: >60 mL/min (ref >60)
Glucose, Bld: 111 mg/dL — ABNORMAL HIGH (ref 70–99)
Potassium: 4 mmol/L (ref 3.5–5.1)
Sodium: 140 mmol/L (ref 135–145)

## 2021-06-28 MED ORDER — LACTATED RINGERS IV BOLUS
1000.0000 mL | Freq: Once | INTRAVENOUS | Status: AC
  Administered 2021-06-28: 1000 mL via INTRAVENOUS

## 2021-06-28 NOTE — ED Provider Notes (Signed)
[facility] Provider Note    Event Date/Time   First MD Initiated Contact with Patient 06/28/21 2250     (approximate)   History   Seizures   HPI  Angel Rice is a 26 y.o. 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  Patient is unable provide any additional history on arrival secondary to sleepiness and reportedly being minimally verbal.  Per provider he otherwise has had 1 seizure about every month of last 6 months and has not gotten point with neurology.  He otherwise has not had any sick symptoms such as fevers, cough, vomiting, diarrhea rash or any other sick symptoms as far as caregiver is aware.  The seizures were not witnessed and the second seizure it seems was heard with a thump in the bathroom.      Physical Exam  Triage Vital Signs: ED Triage Vitals [06/28/21 1914]  Enc Vitals Group     BP 104/81     Pulse Rate (!) 102     Resp 20     Temp 98.8 F (37.1 C)     Temp Source Oral     SpO2 95 %     Weight      Height      Head Circumference      Peak Flow      Pain Score      Pain Loc      Pain Edu?      Excl. in GC?     Most recent vital signs: Vitals:   06/28/21 1914 06/28/21 2347  BP: 104/81 111/84  Pulse: (!) 102 86  Resp: 20 16  Temp: 98.8 F (37.1 C)   SpO2: 95% 100%    General: Asleep and snoring but arousable to tactile stimuli. CV:  Good peripheral perfusion.  2+ radial pulses.  Slightly tachycardic. Resp:  Normal effort.  Clear bilaterally. Abd:  No distention.  Soft throughout. Other:  No obvious trauma to the face scalp head or neck.  PERRLA.  No step-offs or deformities or apparent tenderness over the C/T/L-spine.  No obvious trauma effusions or  deformities noted of the bilateral shoulders, elbows, wrist, knees or ankles.   ED Results / Procedures / Treatments  Labs (all labs ordered are listed, but only abnormal results are displayed) Labs Reviewed  BASIC METABOLIC PANEL - Abnormal; Notable for the following components:      Result Value   Glucose, Bld 111 (*)    All other components within normal limits  CBC  HEPATIC FUNCTION PANEL  LIPASE, BLOOD  LAMOTRIGINE LEVEL  URINALYSIS, COMPLETE (UACMP) WITH MICROSCOPIC     EKG  ECGs remarkable for sinus rhythm with a ventricular rate of 78 without clear evidence of acute ischemia or any significant arrhythmia.  Normal axis and unremarkable intervals.  RADIOLOGY CT head and C-spine on my interpretation without evidence of skull fracture, intracranial hemorrhage, edema, mass effect or acute C-spine injury.  There is some artifact in the C-spine.  I reviewed radiology interpretation and agree their findings of no clear acute process.   PROCEDURES:  Critical Care performed: No  Procedures    MEDICATIONS ORDERED IN ED: Medications  lactated ringers bolus 1,000 mL (1,000 mLs Intravenous New Bag/Given 06/28/21 2306)  IMPRESSION / MDM / ASSESSMENT AND PLAN / ED COURSE  I reviewed the triage vital signs and the nursing notes. Patient's presentation is most consistent with acute presentation with potential threat to life or bodily function.                               Differential diagnosis includes, but is not limited to breakthrough seizure in the setting of subtherapeutic AED levels, electrolyte derangements with a lower suspicion for withdrawal or toxic ingestion or preceding acute infectious process.  ECGs remarkable for sinus rhythm with a ventricular rate of 78 without clear evidence of acute ischemia or any significant arrhythmia.  Normal axis and unremarkable intervals.  BMP and hepatic function panel are unremarkable.  CBC without leukocytosis or acute anemia.   Lipase unremarkable and evidence of pancreatitis.  CT head and C-spine on my interpretation without evidence of skull fracture, intracranial hemorrhage, edema, mass effect or acute C-spine injury.  There is some artifact in the C-spine.  I reviewed radiology interpretation and agree their findings of no clear acute process.  Unclear etiology for breakthrough seizure today I have low suspicion for immediately life-threatening etiology or significant visceral or occult trauma not seen on imaging or exam.  Reassessment patient is much more awake and at his neurological baseline per caregiver at bedside.  He is able to speak although pretty much just repeats back any questions directed at him.  He is able to ambulate without any difficulty and tolerate p.o. without any difficulty.  At this point given observation emergency room over 7 hours without any recurrent seizures I think he is appropriate for continued outpatient evaluation with neurologist.  Keppra level sent.  Discussed returning for any new or worsening symptoms with caregiver at bedside.  Discharged in stable condition.     FINAL CLINICAL IMPRESSION(S) / ED DIAGNOSES   Final diagnoses:  Seizure (HCC)     Rx / DC Orders   ED Discharge Orders     None        Note:  This document was prepared using Dragon voice recognition software and may include unintentional dictation errors.   Gilles Chiquito, MD 06/29/21 0111

## 2021-06-28 NOTE — ED Notes (Signed)
Received report from Efrain RN, Pt sent to CT.

## 2021-06-28 NOTE — ED Triage Notes (Signed)
Pt arrives with caretaker after having 2 seizures today. Per caregiver, 2 seizures in one day is unusual for pt. Pt is from group home. Pt takes Lamictal for seizure and per, caregiver pt has not missed any doses. Per caregiver, pts sister is legal guardian and group home has notified her.

## 2021-06-29 DIAGNOSIS — R569 Unspecified convulsions: Secondary | ICD-10-CM | POA: Diagnosis not present

## 2021-06-29 LAB — HEPATIC FUNCTION PANEL
ALT: 29 U/L (ref 0–44)
AST: 23 U/L (ref 15–41)
Albumin: 4.4 g/dL (ref 3.5–5.0)
Alkaline Phosphatase: 55 U/L (ref 38–126)
Bilirubin, Direct: <0.1 mg/dL (ref 0.0–0.2)
Total Bilirubin: 0.6 mg/dL (ref 0.3–1.2)
Total Protein: 6.9 g/dL (ref 6.5–8.1)

## 2021-06-29 LAB — LIPASE, BLOOD: Lipase: 30 U/L (ref 11–51)

## 2021-07-02 DIAGNOSIS — Z79899 Other long term (current) drug therapy: Secondary | ICD-10-CM | POA: Diagnosis not present

## 2021-07-02 LAB — LAMOTRIGINE LEVEL: Lamotrigine Lvl: 17.1 ug/mL (ref 2.0–20.0)

## 2021-07-03 DIAGNOSIS — Z79899 Other long term (current) drug therapy: Secondary | ICD-10-CM | POA: Diagnosis not present

## 2021-07-03 DIAGNOSIS — R569 Unspecified convulsions: Secondary | ICD-10-CM | POA: Diagnosis not present

## 2021-07-03 DIAGNOSIS — R000 Tachycardia, unspecified: Secondary | ICD-10-CM | POA: Diagnosis not present

## 2021-07-03 DIAGNOSIS — G4733 Obstructive sleep apnea (adult) (pediatric): Secondary | ICD-10-CM | POA: Diagnosis not present

## 2021-07-12 DIAGNOSIS — F840 Autistic disorder: Secondary | ICD-10-CM | POA: Diagnosis not present

## 2021-07-12 DIAGNOSIS — E6609 Other obesity due to excess calories: Secondary | ICD-10-CM | POA: Diagnosis not present

## 2021-07-12 DIAGNOSIS — F428 Other obsessive-compulsive disorder: Secondary | ICD-10-CM | POA: Diagnosis not present

## 2021-07-12 DIAGNOSIS — Z6828 Body mass index (BMI) 28.0-28.9, adult: Secondary | ICD-10-CM | POA: Diagnosis not present

## 2021-07-12 DIAGNOSIS — R0981 Nasal congestion: Secondary | ICD-10-CM | POA: Diagnosis not present

## 2021-07-12 DIAGNOSIS — I10 Essential (primary) hypertension: Secondary | ICD-10-CM | POA: Diagnosis not present

## 2021-07-12 DIAGNOSIS — G40001 Localization-related (focal) (partial) idiopathic epilepsy and epileptic syndromes with seizures of localized onset, not intractable, with status epilepticus: Secondary | ICD-10-CM | POA: Diagnosis not present

## 2021-07-12 DIAGNOSIS — G4700 Insomnia, unspecified: Secondary | ICD-10-CM | POA: Diagnosis not present

## 2021-07-12 DIAGNOSIS — E782 Mixed hyperlipidemia: Secondary | ICD-10-CM | POA: Diagnosis not present

## 2021-07-12 DIAGNOSIS — F250 Schizoaffective disorder, bipolar type: Secondary | ICD-10-CM | POA: Diagnosis not present

## 2021-07-12 DIAGNOSIS — F849 Pervasive developmental disorder, unspecified: Secondary | ICD-10-CM | POA: Diagnosis not present

## 2021-07-12 DIAGNOSIS — F801 Expressive language disorder: Secondary | ICD-10-CM | POA: Diagnosis not present

## 2021-07-19 DIAGNOSIS — R000 Tachycardia, unspecified: Secondary | ICD-10-CM | POA: Diagnosis not present

## 2021-07-19 DIAGNOSIS — G4733 Obstructive sleep apnea (adult) (pediatric): Secondary | ICD-10-CM | POA: Diagnosis not present

## 2021-08-02 DIAGNOSIS — Z79899 Other long term (current) drug therapy: Secondary | ICD-10-CM | POA: Diagnosis not present

## 2021-08-07 DIAGNOSIS — F840 Autistic disorder: Secondary | ICD-10-CM | POA: Diagnosis not present

## 2021-08-20 DIAGNOSIS — Z79899 Other long term (current) drug therapy: Secondary | ICD-10-CM | POA: Diagnosis not present

## 2021-08-20 DIAGNOSIS — R569 Unspecified convulsions: Secondary | ICD-10-CM | POA: Diagnosis not present

## 2021-08-20 DIAGNOSIS — E569 Vitamin deficiency, unspecified: Secondary | ICD-10-CM | POA: Diagnosis not present

## 2021-08-30 DIAGNOSIS — E785 Hyperlipidemia, unspecified: Secondary | ICD-10-CM | POA: Diagnosis not present

## 2021-08-30 DIAGNOSIS — Z79899 Other long term (current) drug therapy: Secondary | ICD-10-CM | POA: Diagnosis not present

## 2021-08-30 DIAGNOSIS — G40909 Epilepsy, unspecified, not intractable, without status epilepticus: Secondary | ICD-10-CM | POA: Diagnosis not present

## 2021-08-30 DIAGNOSIS — F801 Expressive language disorder: Secondary | ICD-10-CM | POA: Diagnosis not present

## 2021-08-30 DIAGNOSIS — F72 Severe intellectual disabilities: Secondary | ICD-10-CM | POA: Diagnosis not present

## 2021-08-30 DIAGNOSIS — F840 Autistic disorder: Secondary | ICD-10-CM | POA: Diagnosis not present

## 2021-09-26 DIAGNOSIS — Z79899 Other long term (current) drug therapy: Secondary | ICD-10-CM | POA: Diagnosis not present

## 2021-10-03 DIAGNOSIS — G43709 Chronic migraine without aura, not intractable, without status migrainosus: Secondary | ICD-10-CM | POA: Diagnosis not present

## 2021-10-03 DIAGNOSIS — R569 Unspecified convulsions: Secondary | ICD-10-CM | POA: Diagnosis not present

## 2021-10-03 DIAGNOSIS — G4733 Obstructive sleep apnea (adult) (pediatric): Secondary | ICD-10-CM | POA: Diagnosis not present

## 2021-10-24 DIAGNOSIS — Z79899 Other long term (current) drug therapy: Secondary | ICD-10-CM | POA: Diagnosis not present

## 2021-11-06 DIAGNOSIS — F840 Autistic disorder: Secondary | ICD-10-CM | POA: Diagnosis not present

## 2021-11-22 DIAGNOSIS — Z79899 Other long term (current) drug therapy: Secondary | ICD-10-CM | POA: Diagnosis not present

## 2021-11-26 ENCOUNTER — Other Ambulatory Visit: Payer: Self-pay

## 2021-11-26 ENCOUNTER — Emergency Department
Admission: EM | Admit: 2021-11-26 | Discharge: 2021-11-26 | Disposition: A | Discharge status: Home / Self Care | Payer: Medicare Other | Attending: Emergency Medicine | Admitting: Emergency Medicine

## 2021-11-26 DIAGNOSIS — F840 Autistic disorder: Secondary | ICD-10-CM | POA: Insufficient documentation

## 2021-11-26 DIAGNOSIS — R569 Unspecified convulsions: Secondary | ICD-10-CM | POA: Diagnosis not present

## 2021-11-26 DIAGNOSIS — G40909 Epilepsy, unspecified, not intractable, without status epilepticus: Secondary | ICD-10-CM | POA: Diagnosis not present

## 2021-11-26 LAB — CBC WITH DIFFERENTIAL/PLATELET
Abs Immature Granulocytes: 0.01 10*3/uL (ref 0.00–0.07)
Basophils Absolute: 0 10*3/uL (ref 0.0–0.1)
Basophils Relative: 0 %
Eosinophils Absolute: 0 10*3/uL (ref 0.0–0.5)
Eosinophils Relative: 0 %
HCT: 47.6 % (ref 39.0–52.0)
Hemoglobin: 15.2 g/dL (ref 13.0–17.0)
Immature Granulocytes: 0 %
Lymphocytes Relative: 30 %
Lymphs Abs: 1.6 10*3/uL (ref 0.7–4.0)
MCH: 27.6 pg (ref 26.0–34.0)
MCHC: 31.9 g/dL (ref 30.0–36.0)
MCV: 86.4 fL (ref 80.0–100.0)
Monocytes Absolute: 0.6 10*3/uL (ref 0.1–1.0)
Monocytes Relative: 11 %
Neutro Abs: 3.2 10*3/uL (ref 1.7–7.7)
Neutrophils Relative %: 59 %
Platelets: 273 10*3/uL (ref 150–400)
RBC: 5.51 MIL/uL (ref 4.22–5.81)
RDW: 13 % (ref 11.5–15.5)
WBC: 5.4 10*3/uL (ref 4.0–10.5)
nRBC: 0 % (ref 0.0–0.2)

## 2021-11-26 LAB — COMPREHENSIVE METABOLIC PANEL
ALT: 23 U/L (ref 0–44)
AST: 18 U/L (ref 15–41)
Albumin: 4.6 g/dL (ref 3.5–5.0)
Alkaline Phosphatase: 59 U/L (ref 38–126)
Anion gap: 10 (ref 5–15)
BUN: 9 mg/dL (ref 6–20)
CO2: 23 mmol/L (ref 22–32)
Calcium: 9.5 mg/dL (ref 8.9–10.3)
Chloride: 107 mmol/L (ref 98–111)
Creatinine, Ser: 1.04 mg/dL (ref 0.61–1.24)
GFR, Estimated: >60 mL/min (ref >60)
Glucose, Bld: 95 mg/dL (ref 70–99)
Potassium: 3.8 mmol/L (ref 3.5–5.1)
Sodium: 140 mmol/L (ref 135–145)
Total Bilirubin: 0.5 mg/dL (ref 0.3–1.2)
Total Protein: 7.2 g/dL (ref 6.5–8.1)

## 2021-11-26 MED ORDER — LAMOTRIGINE 100 MG PO TABS
100.0000 mg | ORAL_TABLET | Freq: Once | ORAL | Status: AC
  Administered 2021-11-26: 100 mg via ORAL
  Filled 2021-11-26: qty 1

## 2021-11-26 MED ORDER — LACOSAMIDE 50 MG PO TABS: 200.0000 mg | ORAL_TABLET | Freq: Two times a day (BID) | ORAL | 0 refills | Status: AC

## 2021-11-26 MED ORDER — LACOSAMIDE 50 MG PO TABS
200.0000 mg | ORAL_TABLET | Freq: Once | ORAL | Status: AC
  Administered 2021-11-26: 200 mg via ORAL
  Filled 2021-11-26: qty 4

## 2021-11-26 NOTE — ED Provider Notes (Signed)
[facility] Provider Note    Event Date/Time   First MD Initiated Contact with Patient 11/26/21 1647     (approximate)   History   Seizures   HPI  Angel Rice is a 26 y.o. male  here with seizure. Pt had a witnessed seizure yesterday. H/o partial seizures on lamictal and locosamide. Happened last night per group home member and pt was still partially responsive but "acting weird" and twitching, then was very drowsy and fell asleep. H/o similar seizures though he normally isn't as responsive. No known recent falls, injuries. He is back to his mental baseline. Has not had any recent med change, and has been taking meds. No recent major weight changes.       Physical Exam   Triage Vital Signs: ED Triage Vitals  Enc Vitals Group     BP 11/26/21 1413 (!) 142/96     Pulse Rate 11/26/21 1413 95     Resp 11/26/21 1413 18     Temp 11/26/21 1413 98 F (36.7 C)     Temp Source 11/26/21 1413 Axillary     SpO2 11/26/21 1413 94 %     Weight --      Height --      Head Circumference --      Peak Flow --      Pain Score 11/26/21 1410 0     Pain Loc --      Pain Edu? --      Excl. in [place]? --     Most recent vital signs: Vitals:   11/26/21 1754 11/26/21 1855  BP: (!) 133/92 (!) 156/88  Pulse: 77 88  Resp: 18 15  Temp: 97.8 F (36.6 C) 97.6 F (36.4 C)  SpO2: 99% 97%     General: Awake, no distress.  CV:  Good peripheral perfusion.  Resp:  Normal effort.  Abd:  No distention.  Other:  No oral or tongue lesions. CNII-XII intact. MAE with 5/5 strength. Normal tone throughout. No seizure like activity.   ED Results / Procedures / Treatments   Labs (all labs ordered are listed, but only abnormal results are displayed) Labs Reviewed  COMPREHENSIVE METABOLIC PANEL  CBC WITH DIFFERENTIAL/PLATELET  URINALYSIS, ROUTINE W REFLEX MICROSCOPIC  LAMOTRIGINE LEVEL  LACOSAMIDE  CBG MONITORING, ED     EKG    RADIOLOGY    I also  independently reviewed and agree with radiologist interpretations.   PROCEDURES:  Critical Care performed: No  MEDICATIONS ORDERED IN ED: Medications  lacosamide (VIMPAT) tablet 200 mg (has no administration in time range)  lamoTRIgine (LAMICTAL) tablet 100 mg (has no administration in time range)     IMPRESSION / MDM / ASSESSMENT AND PLAN / ED COURSE  I reviewed the triage vital signs and the nursing notes.                               This patient presents to the ED for concern of breakthrough seizure, this involves an extensive number of treatment options, and is a complaint that carries with it a high risk of complications and morbidity.  The differential diagnosis includes breakthrough seizure, hypotension/orthostasis, arrhythmia, behavioral issue, tics, medication effect   Co morbidities that complicate the patient evaluation  Autism H/o partial seizures   Additional history obtained:  Additional history obtained from group home caregiver External records from outside source obtained and reviewed including recent Neurology notes  from Dr. Sherryll Burger   Lab Tests:  I Ordered, and personally interpreted labs.  The pertinent results include:   CBC unremarkable, no leukocytosis or anemia CMP at baseline, normal renal fxn and LFTs   Imaging Studies ordered:  I ordered imaging studies including NONE  Cardiac Monitoring: / EKG:  The patient was maintained on a cardiac monitor.  I personally viewed and interpreted the cardiac monitored which showed an underlying rhythm of: sinus rhythm   Problem List / ED Course / Critical interventions / Medication management  Seizure - partial complex, similar to prior, likely breakthrough. No apparent trigger. Levels sent and I reviewed Dr. Margaretmary Eddy outpt note, will increase Vimpat. Toleratted last increase well. Otherwise labs reassuring and he's at his baseline. No new focal deficits.  I ordered medication including Vimpat and Lamictal   for Seizures  Reevaluation of the patient after these medicines showed that the patient stayed the same I have reviewed the patients home medicines and have made adjustments as needed   Social Determinants of Health:  Lives in Group Home with Guardian   Test / Admission - Considered:  No indication - back to baseline, known h/o seizures.   FINAL CLINICAL IMPRESSION(S) / ED DIAGNOSES   Final diagnoses:  Seizure (HCC)     Rx / DC Orders   ED Discharge Orders          Ordered    lacosamide (VIMPAT) 50 MG TABS tablet  2 times daily        11/26/21 1935             Note:  This document was prepared using Dragon voice recognition software and may include unintentional dictation errors.   Shaune Pollack, MD 11/26/21 1943

## 2021-11-26 NOTE — ED Triage Notes (Addendum)
Pt to ED via POV from [facility]. Caregiver states they believe he had a seizure last night. Pt had an appointment with Neurologist who recommended they come to ED. Caregiver states pt has been compliant with meds. Pt with hx autism.

## 2021-11-26 NOTE — Discharge Instructions (Addendum)
We are going to INCREASE your LACOSAMIDE (VIMPAT) from 150 mg twice a day to 200 mg twice a day.  You were given a dose of your VIMPAT and LAMICTAL tonight in the ER.  Call Dr. Trena Platt office tomorrow to discuss

## 2021-11-28 LAB — LAMOTRIGINE LEVEL: Lamotrigine Lvl: 17.7 ug/mL (ref 2.0–20.0)

## 2021-11-30 LAB — LACOSAMIDE: Lacosamide: 4.1 ug/mL — ABNORMAL LOW (ref 5.0–10.0)

## 2021-12-20 DIAGNOSIS — Z79899 Other long term (current) drug therapy: Secondary | ICD-10-CM | POA: Diagnosis not present

## 2021-12-26 DIAGNOSIS — R569 Unspecified convulsions: Secondary | ICD-10-CM | POA: Diagnosis not present

## 2022-01-17 DIAGNOSIS — Z79899 Other long term (current) drug therapy: Secondary | ICD-10-CM | POA: Diagnosis not present

## 2022-04-15 ENCOUNTER — Emergency Department
Admission: EM | Admit: 2022-04-15 | Discharge: 2022-04-15 | Disposition: A | Discharge status: Home / Self Care | Payer: Medicare Other | Attending: Emergency Medicine | Admitting: Emergency Medicine

## 2022-04-15 ENCOUNTER — Other Ambulatory Visit: Payer: Self-pay

## 2022-04-15 DIAGNOSIS — R569 Unspecified convulsions: Secondary | ICD-10-CM | POA: Insufficient documentation

## 2022-04-15 DIAGNOSIS — F840 Autistic disorder: Secondary | ICD-10-CM | POA: Diagnosis not present

## 2022-04-15 DIAGNOSIS — R000 Tachycardia, unspecified: Secondary | ICD-10-CM | POA: Insufficient documentation

## 2022-04-15 DIAGNOSIS — I10 Essential (primary) hypertension: Secondary | ICD-10-CM | POA: Diagnosis not present

## 2022-04-15 DIAGNOSIS — R739 Hyperglycemia, unspecified: Secondary | ICD-10-CM | POA: Diagnosis not present

## 2022-04-15 LAB — URINALYSIS, ROUTINE W REFLEX MICROSCOPIC
Bacteria, UA: NONE SEEN
Bilirubin Urine: NEGATIVE
Glucose, UA: NEGATIVE mg/dL
Hgb urine dipstick: NEGATIVE
Ketones, ur: NEGATIVE mg/dL
Leukocytes,Ua: NEGATIVE
Nitrite: NEGATIVE
Protein, ur: NEGATIVE mg/dL
Specific Gravity, Urine: 1.009 (ref 1.005–1.030)
Squamous Epithelial / HPF: NONE SEEN /HPF (ref 0–5)
pH: 8 (ref 5.0–8.0)

## 2022-04-15 LAB — HEPATIC FUNCTION PANEL
ALT: 25 U/L (ref 0–44)
AST: 25 U/L (ref 15–41)
Albumin: 4.8 g/dL (ref 3.5–5.0)
Alkaline Phosphatase: 59 U/L (ref 38–126)
Bilirubin, Direct: <0.1 mg/dL (ref 0.0–0.2)
Total Bilirubin: 0.4 mg/dL (ref 0.3–1.2)
Total Protein: 7.6 g/dL (ref 6.5–8.1)

## 2022-04-15 LAB — CBC
HCT: 48.7 % (ref 39.0–52.0)
Hemoglobin: 15.7 g/dL (ref 13.0–17.0)
MCH: 28.3 pg (ref 26.0–34.0)
MCHC: 32.2 g/dL (ref 30.0–36.0)
MCV: 87.9 fL (ref 80.0–100.0)
Platelets: 259 10*3/uL (ref 150–400)
RBC: 5.54 MIL/uL (ref 4.22–5.81)
RDW: 12.8 % (ref 11.5–15.5)
WBC: 6.8 10*3/uL (ref 4.0–10.5)
nRBC: 0 % (ref 0.0–0.2)

## 2022-04-15 LAB — URINE DRUG SCREEN, QUALITATIVE (ARMC ONLY)
Amphetamines, Ur Screen: NOT DETECTED
Barbiturates, Ur Screen: NOT DETECTED
Benzodiazepine, Ur Scrn: POSITIVE — AB
Cannabinoid 50 Ng, Ur ~~LOC~~: NOT DETECTED
Cocaine Metabolite,Ur ~~LOC~~: NOT DETECTED
MDMA (Ecstasy)Ur Screen: NOT DETECTED
Methadone Scn, Ur: NOT DETECTED
Opiate, Ur Screen: NOT DETECTED
Phencyclidine (PCP) Ur S: NOT DETECTED
Tricyclic, Ur Screen: NOT DETECTED

## 2022-04-15 LAB — BASIC METABOLIC PANEL
Anion gap: 10 (ref 5–15)
BUN: 9 mg/dL (ref 6–20)
CO2: 24 mmol/L (ref 22–32)
Calcium: 9.8 mg/dL (ref 8.9–10.3)
Chloride: 104 mmol/L (ref 98–111)
Creatinine, Ser: 1.03 mg/dL (ref 0.61–1.24)
GFR, Estimated: >60 mL/min (ref >60)
Glucose, Bld: 171 mg/dL — ABNORMAL HIGH (ref 70–99)
Potassium: 4 mmol/L (ref 3.5–5.1)
Sodium: 138 mmol/L (ref 135–145)

## 2022-04-15 LAB — CBG MONITORING, ED: Glucose-Capillary: 107 mg/dL — ABNORMAL HIGH (ref 70–99)

## 2022-04-15 NOTE — ED Notes (Signed)
Red top, light green and lavender top obtained and sent to lab at this time.  EKG obtained.

## 2022-04-15 NOTE — ED Provider Notes (Signed)
[facility] Provider Note    Event Date/Time   First MD Initiated Contact with Patient 04/15/22 1336     (approximate)   History   Seizures   HPI  Angel Rice is a 27 y.o. male with history of autism, seizures, hypertension, and as listed in EMR presents to the emergency department for evaluation after having had 2 seizures.  One last night and 1 this morning.  His caregiver states that the seizures were pretty typical and lasted roughly a minute. The seizure last night was as he was walking to the bathroom. Caregiver saw him start shaking and was able to get him onto the bed before full seizure. This morning he was sitting in the recliner when he seized. Typically, he has one seizure per month. He is cognitively at baseline and ambulatory per his usual.  Caregiver just wanted to ensure nothing else was going on since the seizures were both within 24 hours. Patient is scheduled for EMU admission on 04/19/2022.    Physical Exam   Triage Vital Signs: ED Triage Vitals  Enc Vitals Group     BP 04/15/22 1145 (!) 141/100     Pulse Rate 04/15/22 1145 (!) 105     Resp 04/15/22 1145 18     Temp 04/15/22 1145 (!) 97.4 F (36.3 C)     Temp Source 04/15/22 1145 Oral     SpO2 04/15/22 1145 96 %     Weight 04/15/22 1157 220 lb 14.4 oz (100.2 kg)     Height 04/15/22 1157 6' (1.829 m)     Head Circumference --      Peak Flow --      Pain Score 04/15/22 1156 2     Pain Loc --      Pain Edu? --      Excl. in [place]? --     Most recent vital signs: Vitals:   04/15/22 1430 04/15/22 1459  BP: 117/80 117/80  Pulse: 92 92  Resp: 17 17  Temp:  97.7 F (36.5 C)  SpO2:  96%    General: Awake, no distress.  CV:  Good peripheral perfusion.  Resp:  Normal effort.  Abd:  No distention.  Other:     ED Results / Procedures / Treatments   Labs (all labs ordered are listed, but only abnormal results are displayed) Labs Reviewed  BASIC METABOLIC PANEL - Abnormal;  Notable for the following components:      Result Value   Glucose, Bld 171 (*)    All other components within normal limits  URINALYSIS, ROUTINE W REFLEX MICROSCOPIC - Abnormal; Notable for the following components:   Color, Urine STRAW (*)    APPearance CLEAR (*)    All other components within normal limits  CBG MONITORING, ED - Abnormal; Notable for the following components:   Glucose-Capillary 107 (*)    All other components within normal limits  URINE CULTURE  CBC  HEPATIC FUNCTION PANEL  LAMOTRIGINE LEVEL  URINE DRUG SCREEN, QUALITATIVE ([place])     EKG  Not indicated.   RADIOLOGY  Image and radiology report reviewed and interpreted by me. Radiology report consistent with the same.  Not indicated.  PROCEDURES:  Critical Care performed: No  Procedures   MEDICATIONS ORDERED IN ED:  Medications - No data to display   IMPRESSION / MDM / [place] / ED COURSE   I have reviewed the triage note.  Differential diagnosis includes, but is  not limited to, epilepsy,  Patient's presentation is most consistent with acute complicated illness / injury requiring diagnostic workup.  27 year old male presenting to the emergency department for treatment and evaluation after 2 seizures within the past 24 hours.  See HPI for further details.  Exam is overall reassuring and the caregiver states that patient is at his baseline.  Labs are pending.  Clinical Course as of 04/15/22 1559  Mon Apr 15, 2022  1424 Lamotrigine level is pending but lab advises it is a send out test.  BMP is unremarkable with the exception of a mild hyperglycemia of 171, but this is a nonfasting test.  CBC is within normal limits.  Plan will be to discuss with neurology on-call, Dr. Quinn Axe.  [CT]  22 Consulted with Dr. Quinn Axe. Additional labs ordered as requested.  [CT]  1553 No change to medication at this time, especially since he has an upcoming EMU admission.  Labs are all reassuring.  Plan will be to discharge him home.  [CT]    Clinical Course User Index [CT] Khadir Roam B, FNP     FINAL CLINICAL IMPRESSION(S) / ED DIAGNOSES   Final diagnoses:  Seizure ([place])     Rx / DC Orders   ED Discharge Orders     None        Note:  This document was prepared using Dragon voice recognition software and may include unintentional dictation errors.   Victorino Dike, FNP 04/15/22 1600    Lavonia Drafts, MD 04/15/22 [phone]

## 2022-04-15 NOTE — ED Triage Notes (Signed)
Pt here with seizures. Pt had a seizure last night and this morning. Pt is compliant with his medications. Caregiver is here with pt.

## 2022-04-16 LAB — LAMOTRIGINE LEVEL: Lamotrigine Lvl: 13.6 ug/mL (ref 2.0–20.0)

## 2022-04-16 LAB — URINE CULTURE: Culture: NO GROWTH

## 2022-05-06 ENCOUNTER — Ambulatory Visit
Admission: EM | Admit: 2022-05-06 | Discharge: 2022-05-06 | Disposition: A | Discharge status: Home / Self Care | Payer: Medicare Other | Attending: Family Medicine | Admitting: Family Medicine

## 2022-05-06 DIAGNOSIS — H1031 Unspecified acute conjunctivitis, right eye: Secondary | ICD-10-CM | POA: Diagnosis not present

## 2022-05-06 MED ORDER — POLYMYXIN B-TRIMETHOPRIM 10000-0.1 UNIT/ML-% OP SOLN: 2.0000 [drp] | Freq: Four times a day (QID) | OPHTHALMIC | 0 refills | Status: AC

## 2022-05-06 NOTE — ED Provider Notes (Signed)
[facility]    CSN: [id number] Arrival date & time: 05/06/22  0844      History   Chief Complaint Chief Complaint  Patient presents with   Eye Problem    RT eye    HPI HPI  Angel Rice is a 27 y.o. male.   Level 5 caveat for history as pt has developmental aphasia History provided by sister Angel Rice brought in by his sister for right eye irritation that started yesterday that she noticed yesterday after picking him up to go for his sleep study.  Angel Rice does not wear glasses.     Past Medical History:  Diagnosis Date   Autism    Hypertension    Seizures    LAST SEIZURE 05-20-16   Sleep apnea    USES CPAP    There are no problems to display for this patient.   Past Surgical History:  Procedure Laterality Date   CLOSED REDUCTION METACARPAL WITH PERCUTANEOUS PINNING Left 12/10/2016   Procedure: CLOSED REDUCTION METACARPAL WITH PERCUTANEOUS PINNING;  Surgeon: Christena Flake, MD;  Location: [facility] ORS;  Service: Orthopedics;  Laterality: Left;   HARDWARE REMOVAL Left 02/20/2017   Procedure: HARDWARE REMOVAL PIN LEFT RING FINGER;  Surgeon: Christena Flake, MD;  Location: [facility] ORS;  Service: Orthopedics;  Laterality: Left;  left ring finger       Home Medications    Prior to Admission medications   Medication Sig Start Date End Date Taking? Authorizing Provider  trimethoprim-polymyxin b (POLYTRIM) ophthalmic solution Place 2 drops into the right eye every 6 (six) hours. 05/06/22  Yes Randle Shatzer, DO  chlorproMAZINE (THORAZINE) 50 MG tablet Take 50 mg by mouth daily as needed (agitation). Take one tablet by mouth as needed for agitation lasting> 5 minutes. May repeat in 20 minutes if needed. **max 150 mg per 24 hours**    [provider]  cloNIDine (CATAPRES) 0.1 MG tablet Take 0.1-0.2 mg by mouth See admin instructions. Take 0.2 mg by mouth twice daily at 8 am and 8 pm, take 0.1 mg at 2pm    [provider]  cloZAPine (CLOZARIL) 100  MG tablet Take 200-300 mg by mouth See admin instructions. Take 200 mg in the morning and 300 mg at bedtime    [provider]  famotidine (PEPCID) 20 MG tablet Take 20 mg by mouth 2 (two) times daily.    [provider]  FEROSUL 325 (65 Fe) MG tablet Take by mouth.    [provider]  fexofenadine (ALLEGRA) 180 MG tablet Take 180 mg by mouth daily.    [provider]  fluticasone (FLONASE) 50 MCG/ACT nasal spray Place 2 sprays into both nostrils daily.    [provider]  ibuprofen (ADVIL,MOTRIN) 800 MG tablet Take 1 tablet (800 mg total) by mouth every 8 (eight) hours as needed for moderate pain. With food 02/20/17   Poggi, Excell Seltzer, MD  lacosamide (VIMPAT) 50 MG TABS tablet Take 4 tablets (200 mg total) by mouth 2 (two) times daily. 11/26/21 12/26/21  Shaune Pollack, MD  lamoTRIgine (LAMICTAL) 100 MG tablet Take 100 mg by mouth 2 (two) times daily.    [provider]  magnesium oxide (MAG-OX) 400 MG tablet  03/16/21   [provider]  polyethylene glycol (MIRALAX / GLYCOLAX) packet Take 17 g by mouth daily as needed for moderate constipation.    [provider]  rosuvastatin (CRESTOR) 10 MG tablet Take by mouth.    [provider]  sertraline (ZOLOFT) 100 MG tablet Take 200 mg by mouth every morning.     [provider]  temazepam (RESTORIL) 15 MG capsule Take 15 mg by mouth at bedtime.    [provider]  Vitamin D, Ergocalciferol, (DRISDOL) 1.25 MG (50000 UNIT) CAPS capsule  08/22/21   [provider]    Family History History reviewed. No pertinent family history.  Social History Social History   Tobacco Use   Smoking status: Never   Smokeless tobacco: Never  Vaping Use   Vaping Use: Never used  Substance Use Topics   Alcohol use: No   Drug use: No     Allergies   Benzodiazepines, Diazepam, Erythromycin, and Sulfa antibiotics   Review of Systems Review of Systems: other  pt  with developmental aphasia and ASD    Physical Exam Triage Vital Signs ED Triage Vitals  Enc Vitals Group     BP 05/06/22 0911 126/61     Pulse Rate 05/06/22 0911 (!) 110     Resp --      Temp 05/06/22 0911 98.4 F (36.9 C)     Temp Source 05/06/22 0911 Oral     SpO2 05/06/22 0911 96 %     Weight 05/06/22 0907 220 lb 14.4 oz (100.2 kg)     Height 05/06/22 0907 6' (1.829 m)     Head Circumference --      Peak Flow --      Pain Score --      Pain Loc --      Pain Edu? --      Excl. in [place]? --    No data found.  Updated Vital Signs BP 126/61 (BP Location: Right Arm)   Pulse (!) 110   Temp 98.4 F (36.9 C) (Oral)   Ht 6' (1.829 m)   Wt 100.2 kg   SpO2 96%   BMI 29.96 kg/m   Visual Acuity Right Eye Distance:   Left Eye Distance:   Bilateral Distance:    Right Eye Near:   Left Eye Near:    Bilateral Near:     Physical Exam  GEN: pleasant well appearing male, in no acute distress  NECK: normal ROM  CV: regular rhythm, tachycardic RESP: no increased work of breathing, clear to ascultation bilaterally EYES:     General: Lids are normal, no foreign bodies appreciated.     Right eye: Mucoid discharge and the medial canthus, no chemosis, no subconjunctival hemorrhage        Left eye: No foreign body, discharge or hordeolum.     Extraocular Movements: Extraocular movements appear to be intact.  SKIN: warm and dry   UC Treatments / Results  Labs (all labs ordered are listed, but only abnormal results are displayed) Labs Reviewed - No data to display  EKG   Radiology No results found.  Procedures Procedures (including critical care time)  Medications Ordered in UC Medications - No data to display  Initial Impression / Assessment and Plan / UC Course  I have reviewed the triage vital signs and the nursing notes.  Pertinent labs & imaging results that were available during my care of the patient were reviewed by me and considered in my medical decision  making (see chart for details).     Patient is a 27 y.o. male who presents after right eye redness and discharge for the past day.  On exam, he has a evidence of conjunctivitis on the right. Treat with  Polytrim eye drops.  Advised to follow-up with an ophthalmologist or optometrist, if  discomfort/pain is not improving after 7day course. Understanding voiced by sister.   Discussed MDM, treatment plan and plan for follow-up with sister who agrees with plan.  Final Clinical Impressions(s) / UC Diagnoses   Final diagnoses:  Acute bacterial conjunctivitis of right eye     Discharge Instructions      Stop by the pharmacy to pick up his antibiotic eyedrops.     ED Prescriptions     Medication Sig Dispense Auth. Provider   trimethoprim-polymyxin b (POLYTRIM) ophthalmic solution Place 2 drops into the right eye every 6 (six) hours. 10 mL Katha Cabal, DO      PDMP not reviewed this encounter.   Katha Cabal, DO 05/06/22 [phone]

## 2022-05-06 NOTE — Discharge Instructions (Addendum)
Stop by the pharmacy to pick up his antibiotic eyedrops.

## 2022-05-06 NOTE — ED Triage Notes (Signed)
Pt presents to UC accompanied by caregiver(sister) c/o RT eye irritation onset yesterday.

## 2023-04-14 ENCOUNTER — Other Ambulatory Visit: Payer: Self-pay

## 2023-04-14 ENCOUNTER — Emergency Department
Admission: EM | Admit: 2023-04-14 | Discharge: 2023-04-14 | Disposition: A | Discharge status: Home / Self Care | Attending: Emergency Medicine | Admitting: Emergency Medicine

## 2023-04-14 ENCOUNTER — Encounter: Payer: Self-pay | Admitting: Emergency Medicine

## 2023-04-14 DIAGNOSIS — F840 Autistic disorder: Secondary | ICD-10-CM | POA: Diagnosis not present

## 2023-04-14 DIAGNOSIS — L0231 Cutaneous abscess of buttock: Secondary | ICD-10-CM | POA: Insufficient documentation

## 2023-04-14 MED ORDER — DOXYCYCLINE HYCLATE 50 MG PO CAPS: 100.0000 mg | ORAL_CAPSULE | Freq: Two times a day (BID) | ORAL | 0 refills | Status: AC

## 2023-04-14 NOTE — ED Triage Notes (Signed)
 Patient to ED via POV for abscess on right side of buttocks. Draining some blood. Unsure how long it has been there. Hx autism. In ED with group home staff

## 2023-04-14 NOTE — ED Provider Notes (Signed)
 [facility] Provider Note   Event Date/Time   First MD Initiated Contact with Patient 04/14/23 1526     (approximate) History  Abscess  HPI Angel Rice is a 28 y.o. male w/ a PMHx of autism who presents with a caregiver who states that patient was noted to have blood and purulence in his underwear. Upon their examination, pt has an abscess to the lower interior right buttock that is open and draining. Pt had no complaints and continues to have no complaints. ROS: unable to assess   Physical Exam  Triage Vital Signs: ED Triage Vitals  Encounter Vitals Group     BP 04/14/23 1121 (!) 130/93     Systolic BP Percentile --      Diastolic BP Percentile --      Pulse Rate 04/14/23 1121 (!) 111     Resp 04/14/23 1121 18     Temp 04/14/23 1121 (!) 96.7 F (35.9 C)     Temp Source 04/14/23 1121 Axillary     SpO2 04/14/23 1121 96 %     Weight 04/14/23 1119 190 lb (86.2 kg)     Height 04/14/23 1119 6' (1.829 m)     Head Circumference --      Peak Flow --      Pain Score 04/14/23 1529 4     Pain Loc --      Pain Education --      Exclude from Growth Chart --    Most recent vital signs: Vitals:   04/14/23 1121 04/14/23 1529  BP: (!) 130/93 130/89  Pulse: (!) 111 88  Resp: 18 18  Temp: (!) 96.7 F (35.9 C) 98 F (36.7 C)  SpO2: 96% 96%   General: Awake, cooperative CV:  Good peripheral perfusion.  Resp:  Normal effort.  Abd:  No distention.  Other:  Young adult overweight caucasian male resting comfortably in no acute distress. 3cmx3cm area of erythema, induration, and a central area open draining serosanguinous fluid  ED Results / Procedures / Treatments   PROCEDURES: Critical Care performed: No Procedures MEDICATIONS ORDERED IN ED: Medications - No data to display IMPRESSION / MDM / ASSESSMENT AND PLAN / ED COURSE  I reviewed the triage vital signs and the nursing notes.                             The patient is on the cardiac monitor to  evaluate for evidence of arrhythmia and/or significant heart rate changes. Patient's presentation is most consistent with acute presentation with potential threat to life or bodily function. Presentation most consistent with simple Abscess. Given History, Exam, and Workup I have low suspicion for Cellulitis, Necrotizing Fasciitis, Pyomyositis, Sporotrichosis, Osteomyelitis or other emergent problem as a cause for this presentation. Interventions: none necessary as wound is already open and draining Rx: Doxycycline 100 mg BID x 10 days Disposition: Patient is stable for discharge, advised to follow up with primary care physician in 48 hours.   FINAL CLINICAL IMPRESSION(S) / ED DIAGNOSES   Final diagnoses:  Abscess of buttock, right   Rx / DC Orders   ED Discharge Orders          Ordered    doxycycline (VIBRAMYCIN) 50 MG capsule  2 times daily        04/14/23 1532           Note:  This document was prepared using Dragon voice  recognition software and may include unintentional dictation errors.   Merwyn Katos, MD 04/14/23 [phone]

## 2023-05-29 ENCOUNTER — Other Ambulatory Visit: Payer: Self-pay

## 2023-05-29 ENCOUNTER — Encounter (HOSPITAL_COMMUNITY): Payer: Self-pay

## 2023-05-29 ENCOUNTER — Emergency Department (HOSPITAL_COMMUNITY)
Admission: EM | Admit: 2023-05-29 | Discharge: 2023-05-29 | Disposition: A | Discharge status: Home / Self Care | Attending: Emergency Medicine | Admitting: Emergency Medicine

## 2023-05-29 DIAGNOSIS — Y92410 Unspecified street and highway as the place of occurrence of the external cause: Secondary | ICD-10-CM | POA: Diagnosis not present

## 2023-05-29 DIAGNOSIS — Z041 Encounter for examination and observation following transport accident: Secondary | ICD-10-CM | POA: Diagnosis present
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
